# Patient Record
Sex: Female | Born: 1970
Health system: Southern US, Community
[De-identification: ages and names within clinical notes are randomized; demographics above are authoritative.]

## PROBLEM LIST (undated history)

## (undated) DIAGNOSIS — F1010 Alcohol abuse, uncomplicated: Secondary | ICD-10-CM

## (undated) DIAGNOSIS — J45909 Unspecified asthma, uncomplicated: Secondary | ICD-10-CM

## (undated) DIAGNOSIS — F419 Anxiety disorder, unspecified: Secondary | ICD-10-CM

## (undated) DIAGNOSIS — F32A Depression, unspecified: Secondary | ICD-10-CM

## (undated) DIAGNOSIS — F329 Major depressive disorder, single episode, unspecified: Secondary | ICD-10-CM

## (undated) DIAGNOSIS — J449 Chronic obstructive pulmonary disease, unspecified: Secondary | ICD-10-CM

## (undated) DIAGNOSIS — K08109 Complete loss of teeth, unspecified cause, unspecified class: Secondary | ICD-10-CM

## (undated) HISTORY — DX: Major depressive disorder, single episode, unspecified: F32.9

## (undated) HISTORY — DX: Depression, unspecified: F32.A

## (undated) HISTORY — DX: Anxiety disorder, unspecified: F41.9

## (undated) HISTORY — PX: TUBAL LIGATION: SHX77

---

## 2014-09-23 ENCOUNTER — Emergency Department (HOSPITAL_COMMUNITY): Payer: Self-pay

## 2014-09-23 ENCOUNTER — Encounter (HOSPITAL_COMMUNITY): Payer: Self-pay

## 2014-09-23 ENCOUNTER — Emergency Department (HOSPITAL_COMMUNITY)
Admission: EM | Admit: 2014-09-23 | Discharge: 2014-09-23 | Discharge status: Against Medical Advice | Payer: Self-pay | Attending: Emergency Medicine | Admitting: Emergency Medicine

## 2014-09-23 DIAGNOSIS — K644 Residual hemorrhoidal skin tags: Secondary | ICD-10-CM | POA: Insufficient documentation

## 2014-09-23 DIAGNOSIS — A599 Trichomoniasis, unspecified: Secondary | ICD-10-CM

## 2014-09-23 DIAGNOSIS — A5901 Trichomonal vulvovaginitis: Secondary | ICD-10-CM | POA: Insufficient documentation

## 2014-09-23 DIAGNOSIS — F1010 Alcohol abuse, uncomplicated: Secondary | ICD-10-CM | POA: Insufficient documentation

## 2014-09-23 DIAGNOSIS — R1110 Vomiting, unspecified: Secondary | ICD-10-CM | POA: Insufficient documentation

## 2014-09-23 DIAGNOSIS — Z79899 Other long term (current) drug therapy: Secondary | ICD-10-CM | POA: Insufficient documentation

## 2014-09-23 DIAGNOSIS — Z720 Tobacco use: Secondary | ICD-10-CM | POA: Insufficient documentation

## 2014-09-23 DIAGNOSIS — R197 Diarrhea, unspecified: Secondary | ICD-10-CM | POA: Insufficient documentation

## 2014-09-23 DIAGNOSIS — R188 Other ascites: Secondary | ICD-10-CM | POA: Insufficient documentation

## 2014-09-23 DIAGNOSIS — Z3202 Encounter for pregnancy test, result negative: Secondary | ICD-10-CM | POA: Insufficient documentation

## 2014-09-23 DIAGNOSIS — J45909 Unspecified asthma, uncomplicated: Secondary | ICD-10-CM | POA: Insufficient documentation

## 2014-09-23 HISTORY — DX: Unspecified asthma, uncomplicated: J45.909

## 2014-09-23 LAB — WET PREP, GENITAL
Clue Cells Wet Prep HPF POC: NONE SEEN
Yeast Wet Prep HPF POC: NONE SEEN

## 2014-09-23 LAB — COMPREHENSIVE METABOLIC PANEL
ALT: 26 U/L (ref 14–54)
AST: 27 U/L (ref 15–41)
Albumin: 3.9 g/dL (ref 3.5–5.0)
Alkaline Phosphatase: 54 U/L (ref 38–126)
Anion gap: 12 (ref 5–15)
BUN: 13 mg/dL (ref 6–20)
CO2: 23 mmol/L (ref 22–32)
Calcium: 9 mg/dL (ref 8.9–10.3)
Chloride: 107 mmol/L (ref 101–111)
Creatinine, Ser: 0.7 mg/dL (ref 0.44–1.00)
GFR calc Af Amer: >60 mL/min (ref >60)
GFR calc non Af Amer: >60 mL/min (ref >60)
Glucose, Bld: 83 mg/dL (ref 65–99)
Potassium: 3.9 mmol/L (ref 3.5–5.1)
Sodium: 142 mmol/L (ref 135–145)
Total Bilirubin: 0.2 mg/dL — ABNORMAL LOW (ref 0.3–1.2)
Total Protein: 6.9 g/dL (ref 6.5–8.1)

## 2014-09-23 LAB — CBC WITH DIFFERENTIAL/PLATELET
Basophils Absolute: 0.1 10*3/uL (ref 0.0–0.1)
Basophils Relative: 1 % (ref 0–1)
Eosinophils Absolute: 0.3 10*3/uL (ref 0.0–0.7)
Eosinophils Relative: 3 % (ref 0–5)
HCT: 46.4 % — ABNORMAL HIGH (ref 36.0–46.0)
Hemoglobin: 16 g/dL — ABNORMAL HIGH (ref 12.0–15.0)
Lymphocytes Relative: 30 % (ref 12–46)
Lymphs Abs: 3.1 10*3/uL (ref 0.7–4.0)
MCH: 34.6 pg — ABNORMAL HIGH (ref 26.0–34.0)
MCHC: 34.5 g/dL (ref 30.0–36.0)
MCV: 100.4 fL — ABNORMAL HIGH (ref 78.0–100.0)
Monocytes Absolute: 1.1 10*3/uL — ABNORMAL HIGH (ref 0.1–1.0)
Monocytes Relative: 10 % (ref 3–12)
Neutro Abs: 5.9 10*3/uL (ref 1.7–7.7)
Neutrophils Relative %: 56 % (ref 43–77)
Platelets: 245 10*3/uL (ref 150–400)
RBC: 4.62 MIL/uL (ref 3.87–5.11)
RDW: 13 % (ref 11.5–15.5)
WBC: 10.4 10*3/uL (ref 4.0–10.5)

## 2014-09-23 LAB — RAPID URINE DRUG SCREEN, HOSP PERFORMED
Amphetamines: NOT DETECTED
Barbiturates: NOT DETECTED
Benzodiazepines: NOT DETECTED
Cocaine: NOT DETECTED
Opiates: NOT DETECTED
Tetrahydrocannabinol: NOT DETECTED

## 2014-09-23 LAB — URINALYSIS, ROUTINE W REFLEX MICROSCOPIC
Bilirubin Urine: NEGATIVE
Glucose, UA: NEGATIVE mg/dL
Ketones, ur: NEGATIVE mg/dL
Nitrite: NEGATIVE
Protein, ur: 100 mg/dL — AB
Specific Gravity, Urine: <1.005 — ABNORMAL LOW (ref 1.005–1.030)
Urobilinogen, UA: 0.2 mg/dL (ref 0.0–1.0)
pH: 5.5 (ref 5.0–8.0)

## 2014-09-23 LAB — URINE MICROSCOPIC-ADD ON

## 2014-09-23 LAB — LIPASE, BLOOD: Lipase: 29 U/L (ref 22–51)

## 2014-09-23 LAB — POC URINE PREG, ED: Preg Test, Ur: NEGATIVE

## 2014-09-23 LAB — ETHANOL: Alcohol, Ethyl (B): 211 mg/dL — ABNORMAL HIGH (ref <5)

## 2014-09-23 LAB — ABO/RH: ABO/RH(D): O POS

## 2014-09-23 LAB — HCG, QUANTITATIVE, PREGNANCY: hCG, Beta Chain, Quant, S: <1 m[IU]/mL (ref <5)

## 2014-09-23 MED ORDER — AZITHROMYCIN 250 MG PO TABS
1000.0000 mg | ORAL_TABLET | Freq: Once | ORAL | Status: AC
  Administered 2014-09-23: 1000 mg via ORAL
  Filled 2014-09-23: qty 4

## 2014-09-23 MED ORDER — IOHEXOL 300 MG/ML  SOLN: 50.0000 mL | Freq: Once | INTRAMUSCULAR | Status: DC | PRN

## 2014-09-23 MED ORDER — LIDOCAINE HCL (PF) 1 % IJ SOLN
INTRAMUSCULAR | Status: AC
  Administered 2014-09-23: 0.9 mL
  Filled 2014-09-23: qty 5

## 2014-09-23 MED ORDER — METRONIDAZOLE 500 MG PO TABS
2000.0000 mg | ORAL_TABLET | Freq: Once | ORAL | Status: AC
  Administered 2014-09-23: 2000 mg via ORAL
  Filled 2014-09-23: qty 4

## 2014-09-23 MED ORDER — CEFTRIAXONE SODIUM 250 MG IJ SOLR
250.0000 mg | Freq: Once | INTRAMUSCULAR | Status: AC
  Administered 2014-09-23: 250 mg via INTRAMUSCULAR
  Filled 2014-09-23: qty 250

## 2014-09-23 MED ORDER — IOHEXOL 300 MG/ML  SOLN: 100.0000 mL | Freq: Once | INTRAMUSCULAR | Status: DC | PRN

## 2014-09-23 NOTE — ED Notes (Signed)
Pt asking how much longer. Called Ct and was advised 0630. Pt returned back to rrom.

## 2014-09-23 NOTE — ED Notes (Addendum)
Pt arrived by EMS from home. Pt states that she has had a positive pregnancy test from her OB/GYN, vaginal bleeding started yesterday. Pt states last menstrual cycle was 30 days ago. Unable to find any fetal heart tones by doppler.

## 2014-09-23 NOTE — ED Provider Notes (Addendum)
TIME SEEN: 4:30 AM  CHIEF COMPLAINT: Vaginal bleeding, abdominal distention  HPI: Pt is a 44 y.o. female with history of asthma who presents the emergency department with abdominal distention for the last 3-4 weeks. States she began having vaginal bleeding tonight. She states that [place]. She states one month ago her primary care doctor told her that she was pregnant. She states that she thinks she had a positive urine pregnancy test. She tells me that her last period however was 30 days ago. When I ask her the date she states March 30. When I asked patient what month it is currently she states July.  She reports she has had vomiting and diarrhea yesterday. No fever. No abdominal pain. No vaginal discharge. She has been pregnant twice before has had 2 children. She denies history of heavy alcohol use but states she did drink 3 beers today. Denies any history of cancer, weight loss.   ROS: See HPI Constitutional: no fever  Eyes: no drainage  ENT: no runny nose   Cardiovascular:  no chest pain  Resp: no SOB  GI: no vomiting GU: no dysuria Integumentary: no rash  Allergy: no hives  Musculoskeletal: no leg swelling  Neurological: no slurred speech ROS otherwise negative  PAST MEDICAL HISTORY/PAST SURGICAL HISTORY:  Past Medical History  Diagnosis Date  . Asthma     MEDICATIONS:  Prior to Admission medications   Medication Sig Start Date End Date Taking? Authorizing Provider  albuterol (PROVENTIL HFA;VENTOLIN HFA) 108 (90 BASE) MCG/ACT inhaler Inhale into the lungs every 6 (six) hours as needed for wheezing or shortness of breath.   Yes Historical Provider, MD    ALLERGIES:  No Known Allergies  SOCIAL HISTORY:  History  Substance Use Topics  . Smoking status: Current Every Day Smoker  . Smokeless tobacco: Not on file  . Alcohol Use: Yes    FAMILY HISTORY: No family history on file.  EXAM: BP 122/96 mmHg  Pulse 78  Temp(Src) 97.8 F (36.6  C) (Oral)  Resp 20  Ht 5\' 1"  (1.549 m)  Wt 100 lb (45.36 kg)  BMI 18.90 kg/m2  SpO2 100% CONSTITUTIONAL: Alert and oriented x 3 and does not appear to answer all questions appropriately and seems slightly confused, smells of alcohol, chronically ill-appearing HEAD: Normocephalic EYES: Conjunctivae clear, PERRL ENT: normal nose; no rhinorrhea; moist mucous membranes; pharynx without lesions noted NECK: Supple, no meningismus, no LAD  CARD: RRR; S1 and S2 appreciated; no murmurs, no clicks, no rubs, no gallops RESP: Normal chest excursion without splinting or tachypnea; breath sounds clear and equal bilaterally; no wheezes, no rhonchi, no rales, no hypoxia or respiratory distress, speaking full sentences ABD/GI: Normal bowel sounds; patient has a distended abdomen with fluid wave, no tympany, soft and nontender, no rebound or guarding, no peritoneal signs GU:  Patient has mild amount of dark red blood coming from the cervical disc, no adnexal tenderness or fullness, no cervical motion tenderness, no other vaginal discharge appreciated, normal external genitalia RECTAL:  Multiple nonthrombosed external hemorrhoids that do not appear to be bleeding BACK:  The back appears normal and is non-tender to palpation, there is no CVA tenderness EXT: Normal ROM in all joints; non-tender to palpation; no edema; normal capillary refill; no cyanosis, no calf tenderness or swelling    SKIN: Normal color for age and race; warm NEURO: Moves all extremities equally, sensation to light touch intact diffusely, cranial nerves II through XII intact, no  asterixis PSYCH: The patient's mood and manner are appropriate. Grooming and personal hygiene are appropriate.  MEDICAL DECISION MAKING: Patient here with abdominal distention. She claims that she was told she was pregnant one month ago but her urine pregnancy here is negative and bedside ultrasound shows no intrauterine pregnancy large volume ascites. Abdominal exam  is benign. I do not think she has SBP. She is afebrile. I suspect that this is either secondary to liver failure or cancer. Will obtain labs, CT of her abdomen and pelvis.  ED PROGRESS: Patient's labs showed alcohol level of 211. Her Quant hCG is less than 1. Wet prep is positive for trichomonas. Given she does not have outpatient follow-up and is an unreliable patient I will treat her not only with 2 g of Flagyl for her trichomonas but also with ceftriaxone and azithromycin to empirically cover her for gonorrhea and chlamydia. Her gonorrhea and chlamydia cultures are pending.  Otherwise her labs are unremarkable including normal LFTs and lipase.  6:15 AM  Pt drank all of her contrast but then decided she did not want to wait for the CT scan. Discussed with patient that CT scan would be performed at 6:30 AM. Have discussed with patient again that I think that her abdominal distention is secondary to ascites as she appears to have a large volume of fluid on bedside ultrasound but I discussed with patient that I cannot be sure if this is from liver failure or underlying cancer or other etiology. She still has a benign abdominal exam. She is has been able to drink contrast without any difficulty or vomiting. She is ambulatory. Oriented 3. I feel she has the capacity to make this decision to leave the emergency department AGAINST MEDICAL ADVICE. I have recommended she follow-up with her primary care doctor and have discussed return precautions. Have also advised her to stop drinking. She verbalized understanding is comfortable this plan and still plans to leave without receiving CT scan, further workup. Patient left prior to receiving paperwork.     Charlene Maw Charlene Azpeitia, DO 09/23/14 0622  Charlene Maw Charlene Bouffard, DO 09/23/14 [id number]

## 2014-09-23 NOTE — ED Notes (Signed)
Pt states she wants to leave & not wait for CT scan. Pt informed by this nurse she has only been treated for what we have found to this point. She was told we still do not know why her stomach is distended & that was what she came in to be checked for. EDP advised pt of the same including possible liver failure and cancer as possible dx. EDP also instructed pt that she needs to stop drinking also. Pt advised she should return to be checked out & to complete the testing. Pt left department w/ steady gait & went to waiting room.

## 2014-09-23 NOTE — ED Notes (Signed)
Pt states she is pregnant but cannot state how far along she is or when her last period was.  Pt c/o vaginal bleeding tonight.

## 2014-09-23 NOTE — ED Notes (Signed)
Pt informed POC pregnancy was negative. Pt then says why is her stomach so big?

## 2014-09-23 NOTE — ED Notes (Signed)
Pt ambulated to restroom & returned to room w/ no complications. 

## 2014-09-23 NOTE — ED Notes (Signed)
Pt came back to nurses station saying she wants to leave, IV removed, explained AMA. Pt signed & went to waiting room.

## 2014-09-23 NOTE — Discharge Instructions (Signed)
Alcohol Use Disorder °Alcohol use disorder is a mental disorder. It is not a one-time incident of heavy drinking. Alcohol use disorder is the excessive and uncontrollable use of alcohol over time that leads to problems with functioning in one or more areas of daily living. People with this disorder risk harming themselves and others when they drink to excess. Alcohol use disorder also can cause other mental disorders, such as mood and anxiety disorders, and serious physical problems. People with alcohol use disorder often misuse other drugs.  °Alcohol use disorder is common and widespread. Some people with this disorder drink alcohol to cope with or escape from negative life events. Others drink to relieve chronic pain or symptoms of mental illness. People with a family history of alcohol use disorder are at higher risk of losing control and using alcohol to excess.  °SYMPTOMS  °Signs and symptoms of alcohol use disorder may include the following:  °· Consumption of alcohol in larger amounts or over a longer period of time than intended. °· Multiple unsuccessful attempts to cut down or control alcohol use.   °· A great deal of time spent obtaining alcohol, using alcohol, or recovering from the effects of alcohol (hangover). °· A strong desire or urge to use alcohol (cravings).   °· Continued use of alcohol despite problems at work, school, or home because of alcohol use.   °· Continued use of alcohol despite problems in relationships because of alcohol use. °· Continued use of alcohol in situations when it is physically hazardous, such as driving a car. °· Continued use of alcohol despite awareness of a physical or psychological problem that is likely related to alcohol use. Physical problems related to alcohol use can involve the brain, heart, liver, stomach, and intestines. Psychological problems related to alcohol use include intoxication, depression, anxiety, psychosis, delirium, and dementia.   °· The need for  increased amounts of alcohol to achieve the same desired effect, or a decreased effect from the consumption of the same amount of alcohol (tolerance). °· Withdrawal symptoms upon reducing or stopping alcohol use, or alcohol use to reduce or avoid withdrawal symptoms. Withdrawal symptoms include: °· Racing heart. °· Hand tremor. °· Difficulty sleeping. °· Nausea. °· Vomiting. °· Hallucinations. °· Restlessness. °· Seizures. °DIAGNOSIS °Alcohol use disorder is diagnosed through an assessment by your health care provider. Your health care provider may start by asking three or four questions to screen for excessive or problematic alcohol use. To confirm a diagnosis of alcohol use disorder, at least two symptoms must be present within a 12-month period. The severity of alcohol use disorder depends on the number of symptoms: °· Mild--two or three. °· Moderate--four or five. °· Severe--six or more. °Your health care provider may perform a physical exam or use results from lab tests to see if you have physical problems resulting from alcohol use. Your health care provider may refer you to a mental health professional for evaluation. °TREATMENT  °Some people with alcohol use disorder are able to reduce their alcohol use to low-risk levels. Some people with alcohol use disorder need to quit drinking alcohol. When necessary, mental health professionals with specialized training in substance use treatment can help. Your health care provider can help you decide how severe your alcohol use disorder is and what type of treatment you need. The following forms of treatment are available:  °· Detoxification. Detoxification involves the use of prescription medicines to prevent alcohol withdrawal symptoms in the first week after quitting. This is important for people with a history of symptoms   of withdrawal and for heavy drinkers who are likely to have withdrawal symptoms. Alcohol withdrawal can be dangerous and, in severe cases, cause  death. Detoxification is usually provided in a hospital or in-patient substance use treatment facility.  Counseling or talk therapy. Talk therapy is provided by substance use treatment counselors. It addresses the reasons people use alcohol and ways to keep them from drinking again. The goals of talk therapy are to help people with alcohol use disorder find healthy activities and ways to cope with life stress, to identify and avoid triggers for alcohol use, and to handle cravings, which can cause relapse.  Medicines.Different medicines can help treat alcohol use disorder through the following actions:  Decrease alcohol cravings.  Decrease the positive reward response felt from alcohol use.  Produce an uncomfortable physical reaction when alcohol is used (aversion therapy).  Support groups. Support groups are run by people who have quit drinking. They provide emotional support, advice, and guidance. These forms of treatment are often combined. Some people with alcohol use disorder benefit from intensive combination treatment provided by specialized substance use treatment centers. Both inpatient and outpatient treatment programs are available. Document Released: 03/27/2004 Document Revised: 07/04/2013 Document Reviewed: 05/27/2012 [facility] Patient Information 2015 [place], [place]. This information is not intended to replace advice given to you by your health care provider. Make sure you discuss any questions you have with your health care provider.  Ascites Ascites is a gathering of fluid in the belly (abdomen). This is most often caused by liver disease. It may also be caused by a number of other less common problems. It causes a ballooning out (distension) of the abdomen. CAUSES  Scarring of the liver (cirrhosis) is the most common cause of ascites. Other causes include:  Infection or inflammation in the abdomen.  Cancer in the abdomen.  Heart failure.  Certain forms of kidney failure  (nephritic syndrome).  Inflammation of the pancreas.  Clots in the veins of the liver. SYMPTOMS  In the early stages of ascites, you may not have any symptoms. The main symptom of ascites is a sense of abdominal bloating. This is due to the presence of fluid. This may also cause an increase in abdominal or waist size. People with this condition can develop swelling in the legs, and men can develop a swollen scrotum. When there is a lot of fluid, it may be hard to breath. Stretching of the abdomen by fluid can be painful. DIAGNOSIS  Certain features of your medical history, such as a history of liver disease and of an enlarging abdomen, can suggest the presence of ascites. The diagnosis of ascites can be made on physical exam by your caregiver. An abdominal ultrasound examination can confirm that ascites is present, and estimate the amount of fluid. Once ascites is confirmed, it is important to determine its cause. Again, a history of one of the conditions listed in "CAUSES" provides a strong clue. A physical exam is important, and blood and X-ray tests may be needed. During a procedure called paracentesis, a sample of fluid is removed from the abdomen. This can determine certain key features about the fluid, such as whether or not infection or cancer is present. Your caregiver will determine if a paracentesis is necessary. They will describe the procedure to you. PREVENTION  Ascites is a complication of other conditions. Therefore to prevent ascites, you must seek treatment for any significant health conditions you have. Once ascites is present, careful attention to fluid and salt intake may  help prevent it from getting worse. If you have ascites, you should not drink alcohol. PROGNOSIS  The prognosis of ascites depends on the underlying disease. If the disease is reversible, such as with certain infections or with heart failure, then ascites may improve or disappear. When ascites is caused by  cirrhosis, then it indicates that the liver disease has worsened, and further evaluation and treatment of the liver disease is needed. If your ascites is caused by cancer, then the success or failure of the cancer treatment will determine whether your ascites will improve or worsen. RISKS AND COMPLICATIONS  Ascites is likely to worsen if it is not properly diagnosed and treated. A large amount of ascites can cause pain and difficulty breathing. The main complication, besides worsening, is infection (called spontaneous bacterial peritonitis). This requires prompt treatment. TREATMENT  The treatment of ascites depends on its cause. When liver disease is your cause, medical management using water pills (diuretics) and decreasing salt intake is often effective. Ascites due to peritoneal inflammation or malignancy (cancer) alone does not respond to salt restriction and diuretics. Hospitalization is sometimes required. If the treatment of ascites cannot be managed with medications, a number of other treatments are available. Your caregivers will help you decide which will work best for you. Some of these are:  Removal of fluid from the abdomen (paracentesis).  Fluid from the abdomen is passed into a vein (peritoneovenous shunting).  Liver transplantation.  Transjugular intrahepatic portosystemic stent shunt. HOME CARE INSTRUCTIONS  It is important to monitor body weight and the intake and output of fluids. Weigh yourself at the same time every day. Record your weights. Fluid restriction may be necessary. It is also important to know your salt intake. The more salt you take in, the more fluid you will retain. Ninety percent of people with ascites respond to this approach.  Follow any directions for medicines carefully.  Follow up with your caregiver, as directed.  Report any changes in your health, especially any new or worsening symptoms.  If your ascites is from liver disease, avoid alcohol and  other substances toxic to the liver. SEEK MEDICAL CARE IF:   Your weight increases more than a few pounds in a few days.  Your abdominal or waist size increases.  You develop swelling in your legs.  You had swelling and it worsens. SEEK IMMEDIATE MEDICAL CARE IF:   You develop a fever.  You develop new abdominal pain.  You develop difficulty breathing.  You develop confusion.  You have bleeding from the mouth, stomach, or rectum. MAKE SURE YOU:   Understand these instructions.  Will watch your condition.  Will get help right away if you are not doing well or get worse. Document Released: 02/17/2005 Document Revised: 05/12/2011 Document Reviewed: 09/18/2006 [facility] Patient Information 2015 [place], [place]. This information is not intended to replace advice given to you by your health care provider. Make sure you discuss any questions you have with your health care provider.  Trichomoniasis Trichomoniasis is an infection caused by an organism called Trichomonas. The infection can affect both women and men. In women, the outer female genitalia and the vagina are affected. In men, the penis is mainly affected, but the prostate and other reproductive organs can also be involved. Trichomoniasis is a sexually transmitted infection (STI) and is most often passed to another person through sexual contact.  RISK FACTORS  Having unprotected sexual intercourse.  Having sexual intercourse with an infected partner. SIGNS AND SYMPTOMS  Symptoms of  trichomoniasis in women include:  Abnormal gray-green frothy vaginal discharge.  Itching and irritation of the vagina.  Itching and irritation of the area outside the vagina. Symptoms of trichomoniasis in men include:   Penile discharge with or without pain.  Pain during urination. This results from inflammation of the urethra. DIAGNOSIS  Trichomoniasis may be found during a Pap test or physical exam. Your health care provider may use  one of the following methods to help diagnose this infection:  Examining vaginal discharge under a microscope. For men, urethral discharge would be examined.  Testing the pH of the vagina with a test tape.  Using a vaginal swab test that checks for the Trichomonas organism. A test is available that provides results within a few minutes.  Doing a culture test for the organism. This is not usually needed. TREATMENT   You may be given medicine to fight the infection. Women should inform their health care provider if they could be or are pregnant. Some medicines used to treat the infection should not be taken during pregnancy.  Your health care provider may recommend over-the-counter medicines or creams to decrease itching or irritation.  Your sexual partner will need to be treated if infected. HOME CARE INSTRUCTIONS   Take medicines only as directed by your health care provider.  Take over-the-counter medicine for itching or irritation as directed by your health care provider.  Do not have sexual intercourse while you have the infection.  Women should not douche or wear tampons while they have the infection.  Discuss your infection with your partner. Your partner may have gotten the infection from you, or you may have gotten it from your partner.  Have your sex partner get examined and treated if necessary.  Practice safe, informed, and protected sex.  See your health care provider for other STI testing. SEEK MEDICAL CARE IF:   You still have symptoms after you finish your medicine.  You develop abdominal pain.  You have pain when you urinate.  You have bleeding after sexual intercourse.  You develop a rash.  Your medicine makes you sick or makes you throw up (vomit). MAKE SURE YOU:  Understand these instructions.  Will watch your condition.  Will get help right away if you are not doing well or get worse. Document Released: 08/13/2000 Document Revised: 07/04/2013  Document Reviewed: 11/29/2012 [facility] Patient Information 2015 [place], [place]. This information is not intended to replace advice given to you by your health care provider. Make sure you discuss any questions you have with your health care provider.

## 2014-09-25 LAB — GC/CHLAMYDIA PROBE AMP (~~LOC~~) NOT AT ARMC
Chlamydia: NEGATIVE
Neisseria Gonorrhea: NEGATIVE

## 2014-10-31 ENCOUNTER — Encounter (HOSPITAL_COMMUNITY): Payer: Self-pay | Admitting: *Deleted

## 2014-10-31 ENCOUNTER — Inpatient Hospital Stay (HOSPITAL_COMMUNITY)
Admission: AD | Admit: 2014-10-31 | Discharge: 2014-10-31 | Discharge status: Against Medical Advice | Payer: Self-pay | Source: Ambulatory Visit | Attending: Obstetrics & Gynecology | Admitting: Obstetrics & Gynecology

## 2014-10-31 DIAGNOSIS — F17200 Nicotine dependence, unspecified, uncomplicated: Secondary | ICD-10-CM | POA: Insufficient documentation

## 2014-10-31 DIAGNOSIS — R109 Unspecified abdominal pain: Secondary | ICD-10-CM | POA: Insufficient documentation

## 2014-10-31 DIAGNOSIS — Z3202 Encounter for pregnancy test, result negative: Secondary | ICD-10-CM | POA: Insufficient documentation

## 2014-10-31 HISTORY — DX: Alcohol abuse, uncomplicated: F10.10

## 2014-10-31 HISTORY — DX: Complete loss of teeth, unspecified cause, unspecified class: K08.109

## 2014-10-31 LAB — URINALYSIS, ROUTINE W REFLEX MICROSCOPIC
Bilirubin Urine: NEGATIVE
Glucose, UA: NEGATIVE mg/dL
Ketones, ur: NEGATIVE mg/dL
Nitrite: NEGATIVE
Protein, ur: NEGATIVE mg/dL
Specific Gravity, Urine: <1.005 — ABNORMAL LOW (ref 1.005–1.030)
Urobilinogen, UA: 0.2 mg/dL (ref 0.0–1.0)
pH: 5.5 (ref 5.0–8.0)

## 2014-10-31 LAB — RAPID URINE DRUG SCREEN, HOSP PERFORMED
Amphetamines: NOT DETECTED
Barbiturates: NOT DETECTED
Benzodiazepines: NOT DETECTED
Cocaine: POSITIVE — AB
Opiates: NOT DETECTED
Tetrahydrocannabinol: NOT DETECTED

## 2014-10-31 LAB — URINE MICROSCOPIC-ADD ON

## 2014-10-31 LAB — POCT PREGNANCY, URINE: Preg Test, Ur: NEGATIVE

## 2014-10-31 NOTE — Progress Notes (Signed)
L. Clemmons, CNM in to assess patient.

## 2014-10-31 NOTE — Progress Notes (Signed)
Patient came out to nurse's desk and stated she was going to go outside. Encouraged patient to remain in room so that provider could speak with her regarding plan of care. Patient insisted that she go outside, that she was not leaving and would return to her room.

## 2014-10-31 NOTE — MAU Provider Note (Signed)
  History     CSN: [id number]  Arrival date and time: 10/31/14 1748   None     Chief Complaint  Patient presents with  . Abdominal Pain   HPI  Charlene Woods 44 y.o. [id number] Presents via EMS stating that she is having abdominal pain that started last night and she thinks she is pregnant. She is homeless.  Past Medical History  Diagnosis Date  . Asthma   . Teeth missing   . Alcohol abuse     No past surgical history on file.  No family history on file.  Social History  Substance Use Topics  . Smoking status: Current Every Day Smoker  . Smokeless tobacco: None  . Alcohol Use: Yes    Allergies: No Known Allergies  Prescriptions prior to admission  Medication Sig Dispense Refill Last Dose  . albuterol (PROVENTIL HFA;VENTOLIN HFA) 108 (90 BASE) MCG/ACT inhaler Inhale into the lungs every 6 (six) hours as needed for wheezing or shortness of breath.       Review of Systems  Gastrointestinal: Positive for abdominal pain.   Physical Exam   Blood pressure 134/86, pulse 62, temperature 98.4 F (36.9 C), temperature source Oral, resp. rate 18.  Physical Exam  Nursing note and vitals reviewed. Constitutional: She appears well-developed. No distress.  HENT:  Head: Normocephalic.  Cardiovascular: Normal rate.   Respiratory: Effort normal.  GI:  Abdominal ascites. Nontender on palpation  Skin: She is not diaphoretic. There is erythema.  Skin has reddened appearance  Psychiatric:  Pt is convinced that she is pregnant    MAU Course  Procedures  MDM Discussed appropriateness of pt being here at [facility] with negative pregnancy test. Consulted Dr. Lynetta Mare and Dr Alvester Morin re POC- Will draw labs and posssibly transfer to [facility] pending results. Pt told nurses that she wanted to sign out AMA but did not; she went out side and did not come back.  Assessment and Plan  Abdominal Pain  Clemmons,Lori Grissett 10/31/2014, 7:05 PM

## 2014-10-31 NOTE — Progress Notes (Signed)
To BR .Urine sample obtained

## 2014-10-31 NOTE — Progress Notes (Signed)
Patient not in room when lab came to draw blood.

## 2014-10-31 NOTE — MAU Note (Addendum)
Patient arrived via EMS. C/O LUQ pain that started after she vomited X 1. [facility] and [facility]. States she drank a 40 oz beer. Patient obviously intoxicated.

## 2014-10-31 NOTE — MAU Note (Signed)
Pt has not returned, is not in lobby, can not see her outside of unit- removed from board

## 2015-06-12 ENCOUNTER — Encounter (HOSPITAL_COMMUNITY): Payer: Self-pay | Admitting: Emergency Medicine

## 2015-06-12 ENCOUNTER — Emergency Department (HOSPITAL_COMMUNITY)
Admission: EM | Admit: 2015-06-12 | Discharge: 2015-06-13 | Disposition: A | Discharge status: Home / Self Care | Payer: Self-pay | Attending: Emergency Medicine | Admitting: Emergency Medicine

## 2015-06-12 DIAGNOSIS — W57XXXA Bitten or stung by nonvenomous insect and other nonvenomous arthropods, initial encounter: Secondary | ICD-10-CM | POA: Insufficient documentation

## 2015-06-12 DIAGNOSIS — Y939 Activity, unspecified: Secondary | ICD-10-CM | POA: Insufficient documentation

## 2015-06-12 DIAGNOSIS — J45909 Unspecified asthma, uncomplicated: Secondary | ICD-10-CM | POA: Insufficient documentation

## 2015-06-12 DIAGNOSIS — F17200 Nicotine dependence, unspecified, uncomplicated: Secondary | ICD-10-CM | POA: Insufficient documentation

## 2015-06-12 DIAGNOSIS — T63441A Toxic effect of venom of bees, accidental (unintentional), initial encounter: Secondary | ICD-10-CM | POA: Insufficient documentation

## 2015-06-12 DIAGNOSIS — Y929 Unspecified place or not applicable: Secondary | ICD-10-CM | POA: Insufficient documentation

## 2015-06-12 DIAGNOSIS — Y999 Unspecified external cause status: Secondary | ICD-10-CM | POA: Insufficient documentation

## 2015-06-12 NOTE — ED Notes (Signed)
Pt states she was stung on the left middle finger and then developed sob. Pt states the sob is better at this time.

## 2015-06-13 MED ORDER — DIPHENHYDRAMINE HCL 25 MG PO TABS: 25.0000 mg | ORAL_TABLET | Freq: Four times a day (QID) | ORAL | Status: DC

## 2015-06-13 MED ORDER — PREDNISONE 50 MG PO TABS
60.0000 mg | ORAL_TABLET | Freq: Once | ORAL | Status: AC
  Administered 2015-06-13: 60 mg via ORAL
  Filled 2015-06-13: qty 1

## 2015-06-13 MED ORDER — DIPHENHYDRAMINE HCL 25 MG PO CAPS
25.0000 mg | ORAL_CAPSULE | Freq: Once | ORAL | Status: AC
  Administered 2015-06-13: 25 mg via ORAL
  Filled 2015-06-13: qty 1

## 2015-06-13 NOTE — ED Provider Notes (Signed)
CSN: [id number]     Arrival date & time 06/12/15  2308 History   First MD Initiated Contact with Patient 06/13/15 0000     Chief Complaint  Patient presents with  . Insect Bite     (Consider location/radiation/quality/duration/timing/severity/associated sxs/prior Treatment) HPI   Charlene Woods is a 45 y.o. female who presents to the Emergency Department complaining of bee sting to her left middle finger.  She states that she felt a bee sting on her finger while walking up her steps.  She states she did not see the insect.  She describes a throbbing stinging sensation to the tip of her finger.  She also reports a brief episode of shortness of breath at onset, but has resolved since ED arrival.  She has not tried any therapies prior to arrival.  She reports a history of previous allergy to bee stings including difficulty breathing.  She denies swelling of her face, lips or tongue, numbness of her finger or shortness of breath at present.     Past Medical History  Diagnosis Date  . Asthma   . Teeth missing   . Alcohol abuse    History reviewed. No pertinent past surgical history. History reviewed. No pertinent family history. Social History  Substance Use Topics  . Smoking status: Current Every Day Smoker  . Smokeless tobacco: None  . Alcohol Use: Yes   OB History    Gravida Para Term Preterm AB TAB SAB Ectopic Multiple Living   Review of Systems  Constitutional: Negative for fever and chills.  HENT: Negative for facial swelling, trouble swallowing and voice change.   Respiratory: Negative for chest tightness, shortness of breath, wheezing and stridor.   Cardiovascular: Negative for chest pain.  Musculoskeletal: Positive for arthralgias. Negative for joint swelling.  Skin:       Bee sting left middle finger  Neurological: Negative for dizziness, weakness and numbness.  All other systems reviewed and are negative.     Allergies  Aspirin; Bee pollen;  Codeine; Morphine and related; and Penicillins  Home Medications   Prior to Admission medications   Medication Sig Start Date End Date Taking? Authorizing Provider  albuterol (PROVENTIL HFA;VENTOLIN HFA) 108 (90 BASE) MCG/ACT inhaler Inhale into the lungs every 6 (six) hours as needed for wheezing or shortness of breath.    Historical Provider, MD   BP 117/105 mmHg  Pulse 65  Temp(Src) 98.8 F (37.1 C) (Oral)  Resp 14  Ht  (1.6 m)  Wt 58.968 kg  BMI 23.03 kg/m2  SpO2 96%  LMP 06/12/2015 Physical Exam  Constitutional: She is oriented to person, place, and time. She appears well-developed and well-nourished. No distress.  HENT:  Head: Atraumatic.  Mouth/Throat: Uvula is midline, oropharynx is clear and moist and mucous membranes are normal. No uvula swelling.  Neck: Normal range of motion. Neck supple.  Cardiovascular: Normal rate, regular rhythm and intact distal pulses.   Pulmonary/Chest: Effort normal and breath sounds normal. No respiratory distress. She has no wheezes.  Musculoskeletal: Normal range of motion.  Neurological: She is alert and oriented to person, place, and time. Coordination normal.  Skin: Skin is warm and dry.  Localized bee sting to the distal tip of the left middle finger.  No edema or lymphangitis.  Pt has full ROM of the finger.  Sensation intact  Psychiatric: She has a normal mood and affect. Thought content normal.  Nursing  note and vitals reviewed.   ED Course  Procedures (including critical care time) Labs Review Labs Reviewed - No data to display  Imaging Review No results found. I have personally reviewed and evaluated these images and lab results as part of my medical decision-making.   EKG Interpretation None      MDM   Final diagnoses:  Bee sting, accidental or unintentional, initial encounter    Pt with bee sting to left middle finger.  Localized reaction.  She has been observed in the dept without complications.  Drank  fluids and watching TV.  Stable for discharge.     Pauline Ausammy Kenzie Thoreson, PA-C 06/13/15 0100  Dione Boozeavid Glick, MD 06/13/15 [id number]

## 2015-06-13 NOTE — Discharge Instructions (Signed)
Bee, Wasp, or [employer], wasps, and hornets are part of a family of insects that can sting people. These stings can cause pain and inflammation, but they are usually not serious. However, some people may have an allergic reaction to a sting. This can cause the symptoms to be more severe.  SYMPTOMS  Common symptoms of this condition include:   A red lump in the skin that sometimes has a tiny hole in the center. In some cases, a stinger may be in the center of the wound.  Pain and itching at the sting site.  Redness and swelling around the sting site. If you have an allergic reaction (localized allergic reaction), the swelling and redness may spread out from the sting site. In some cases, this reaction can continue to develop over the next 12-36 hours. In rare cases, a person may have a severe allergic reaction (anaphylactic reaction) to a sting. Symptoms of an anaphylactic reaction may include:   Wheezing or difficulty breathing.  Raised, itchy, red patches on the skin.  Nausea or vomiting.  Abdominal cramping.  Diarrhea.  Chest pain.  Fainting.  Redness of the face (flushing). DIAGNOSIS  This condition is usually diagnosed based on symptoms, medical history, and a physical exam. TREATMENT  Most stings can be treated with:   Icing to reduce swelling.  Medicines (antihistamines) to treat itching or an allergic reaction.  Medicines to help reduce pain. These may be medicines that you take by mouth, or medicated creams or lotions that you apply to your skin. If you were stung by a bee, the stinger and a small sac of poison may be in the wound. This may be removed by brushing across it with a flat card, such as a credit card. Another method is to pinch the area and pull it out. These methods can help reduce the severity of the body's reaction to the sting.  HOME CARE INSTRUCTIONS   Wash the sting site daily with soap and water as told by your health care provider.  Apply  or take over-the-counter and prescription medicines only as told by your health care provider.  If directed, apply ice to the sting area.  Put ice in a plastic bag.  Place a towel between your skin and the bag.  Leave the ice on for 20 minutes, 2-3 times per day.  Do not scratch the sting area.  To lessen pain, try using a paste that is made of water and baking soda. Rub the paste on the sting area and leave it on for 5 minutes.  If you had a severe allergic reaction to a sting, you may need:  To wear a medical bracelet or necklace that lists the allergy.  To learn when and how to use an anaphylaxis kit or epinephrine injection. Your family members may also need to learn this.  To carry an anaphylaxis kit with you at all times. SEEK MEDICAL CARE IF:   Your symptoms do not get better in 2-3 days.  You have redness, swelling, or pain that spreads beyond the area of the sting.  You have a fever. SEEK IMMEDIATE MEDICAL CARE IF:  You have symptoms of a severe allergic reaction. These include:   Wheezing or difficulty breathing.  Chest pain.  Light-headedness or fainting.  Itchy, raised, red patches on the skin.  Nausea or vomiting.  Abdominal cramping.  Diarrhea.   This information is not intended to replace advice given to you by your health care provider.  Make sure you discuss any questions you have with your health care provider. °  °Document Released: 02/17/2005 Document Revised: 11/08/2014 Document Reviewed: 07/05/2014 °Elsevier Interactive Patient Education ©2016 [employer] ° °

## 2015-08-15 ENCOUNTER — Other Ambulatory Visit: Payer: Self-pay | Admitting: Internal Medicine

## 2015-08-15 ENCOUNTER — Ambulatory Visit: Payer: Self-pay | Attending: Internal Medicine | Admitting: *Deleted

## 2015-08-15 ENCOUNTER — Ambulatory Visit
Admission: RE | Admit: 2015-08-15 | Discharge: 2015-08-15 | Disposition: A | Discharge status: Home / Self Care | Payer: Self-pay | Source: Ambulatory Visit | Attending: Internal Medicine | Admitting: Internal Medicine

## 2015-08-15 ENCOUNTER — Encounter: Payer: Self-pay | Admitting: *Deleted

## 2015-08-15 VITALS — BP 124/84 | HR 53 | Temp 98.3°F | Ht 62.99 in | Wt 127.2 lb

## 2015-08-15 DIAGNOSIS — Z0000 Encounter for general adult medical examination without abnormal findings: Secondary | ICD-10-CM

## 2015-08-15 NOTE — Progress Notes (Signed)
Subjective:     Patient ID: Charlene Woods, female   DOB: [birth year], 45 y.o.   MRN: [id number]  HPI   Review of Systems     Objective:   Physical Exam  Pulmonary/Chest: Right breast exhibits no inverted nipple, no mass, no nipple discharge, no skin change and no tenderness. Left breast exhibits no inverted nipple, no mass, no nipple discharge, no skin change and no tenderness. Breasts are symmetrical.  Abdominal: There is no splenomegaly or hepatomegaly. There is tenderness in the left lower quadrant.  Genitourinary: No labial fusion. There is no rash, tenderness, lesion or injury on the right labia. There is no rash, tenderness, lesion or injury on the left labia. Cervix exhibits friability. Cervix exhibits no discharge. Right adnexum displays no mass, no tenderness and no fullness. Left adnexum displays no mass, no tenderness and no fullness. No erythema, tenderness or bleeding in the vagina. No foreign body around the vagina. No signs of injury around the vagina. No vaginal discharge found.         Assessment:     45 year old White female [facility] exam, pap, mammogram and further evaluation of a pelvic mass.  Clinical breast exam is unremarkable.  Taught self breast awareness.  Specimen collected for pap smear.  There is excoriation noted around the cerivcal os.   On bimanual exam the cervix is firm, full and non-mobile.  There is no dominant pelvic mass noted, but there is tenderness noted in LLQ on exam.  Patient has been screened for eligibility.  She does not have any insurance, Medicare or Medicaid.  She also meets financial eligibility.  Hand-out given on the Affordable Care Act.     Plan:     Screening mammogram ordered.  Specimen for pap smear sent to the lab.  Charlene QuailsChristy Woods will schedule patient for further evaluation of abnormal cervix with [facility].  Will follow up per [facility] protocol.

## 2015-08-15 NOTE — Patient Instructions (Signed)
Gave patient hand-out, Women Staying Healthy, Active and Well from [facility], with education on breast health, pap smears, heart and colon health. 

## 2015-08-17 LAB — PAP LB AND HPV HIGH-RISK
HPV, high-risk: NEGATIVE
PAP Smear Comment: 0

## 2015-08-20 ENCOUNTER — Telehealth: Payer: Self-pay | Admitting: *Deleted

## 2015-08-20 NOTE — Telephone Encounter (Signed)
Patient called to get the results of her mammogram and pap smear.  Explained that both are normal, and that the pap is HPV negative.  Explained that although they both were normal, she still needs to keep her appointment with Dr. Greggory Keenefrancesco on 09/05/15 for further evaluation of the firm, heavy cervix and possible pelvic mass.  She is agreeable.  Next mammo will be due in 1 year and explained that next pap will be due in 5 years, unless otherwise indicated by Dr. Greggory Keenefrancesco.

## 2015-09-05 ENCOUNTER — Encounter: Payer: Self-pay | Admitting: Obstetrics and Gynecology

## 2015-09-13 ENCOUNTER — Telehealth: Payer: Self-pay | Admitting: *Deleted

## 2015-09-13 NOTE — Telephone Encounter (Signed)
Patient no-showed her appointment at [facility] to follow-up on her pelvic mass and full cervix.  Rescheduled patient for 10/18/15 @ 9:30.  Offered a taxi for transportation.  Stressed the importance of keeping her appointment.  States she will call me if she does not have transportation.

## 2015-10-18 ENCOUNTER — Ambulatory Visit (INDEPENDENT_AMBULATORY_CARE_PROVIDER_SITE_OTHER): Payer: Self-pay | Admitting: Obstetrics and Gynecology

## 2015-10-18 ENCOUNTER — Encounter: Payer: Self-pay | Admitting: Obstetrics and Gynecology

## 2015-10-18 VITALS — BP 125/88 | HR 65 | Ht 63.0 in | Wt 128.5 lb

## 2015-10-18 DIAGNOSIS — R1032 Left lower quadrant pain: Secondary | ICD-10-CM | POA: Insufficient documentation

## 2015-10-18 DIAGNOSIS — N889 Noninflammatory disorder of cervix uteri, unspecified: Secondary | ICD-10-CM

## 2015-10-18 NOTE — Patient Instructions (Signed)
1. Pelvic exam is normal today except for some left lower quadrant tenderness. No palpable abnormality is identified 2. Pelvic ultrasound is ordered to assess for any pelvic abnormality possibly contributing to the pain

## 2015-10-18 NOTE — Progress Notes (Signed)
GYN ENCOUNTER NOTE  Subjective:       Charlene Woods is a 45 y.o. [phone] female is here for gynecologic evaluation of the following issues:  1. Bulky cervix 2. Left adnexal mass  [facility] evaluation of above concerns. Pap smear was negative/negative for abnormality. On clinical exam the cervix was thought to be nonmobile, firm, and full; left lower quadrant tenderness was identified without mass  GYN history: Menarche-age 45 Intervals-24 days Duration of flow-3-4 days No history of dysmenorrhea No history of dyspareunia; currently not sexually active No history of abnormal Pap smears  GI history: Bowel movements-daily No history of chronic constipation  GU history: Urinary frequency 4-5 times a day Nocturia 2-3 times per night Stress urinary incontinence present with need to wear pads No history of urgency No history of chronic UTIs or pyelonephritis    Gynecologic History Patient's last menstrual period was 10/08/2015 (exact date). Contraception: abstinence Last Pap: Negative/negative 08/15/2015  Obstetric History OB History  Gravida Para Term Preterm AB Living  6 6 5 1  0 5  SAB TAB Ectopic Multiple Live Births  0 0 0 0 6    # Outcome Date GA Lbr Len/2nd Weight Sex Delivery Anes PTL Lv  6 Term      Vag-Spont   LIV  5 Term      Vag-Spont   LIV  4 Term      Vag-Spont   LIV  3 Term      Vag-Spont   LIV  2 Term      Vag-Spont   LIV  1 Preterm      Vag-Spont   DEC      Past Medical History:  Diagnosis Date  . Alcohol abuse   . Anxiety   . Asthma   . Depression   . Teeth missing     Past Surgical History:  Procedure Laterality Date  . TUBAL LIGATION      Current Outpatient Prescriptions on File Prior to Visit  Medication Sig Dispense Refill  . albuterol (PROVENTIL HFA;VENTOLIN HFA) 108 (90 BASE) MCG/ACT inhaler Inhale into the lungs every 6 (six) hours as needed for wheezing or shortness of breath.    .  diphenhydrAMINE (BENADRYL) 25 MG tablet Take 1 tablet (25 mg total) by mouth every 6 (six) hours. 20 tablet 0   No current facility-administered medications on file prior to visit.     Allergies  Allergen Reactions  . Aspirin   . Bee Pollen   . Codeine   . Morphine And Related   . Penicillins     Social History   Social History  . Marital status: Married    Spouse name: N/A  . Number of children: N/A  . Years of education: N/A   Occupational History  . Not on file.   Social History Main Topics  . Smoking status: Current Every Day Smoker    Packs/day: 0.05    Types: Cigarettes  . Smokeless tobacco: Not on file  . Alcohol use Yes     Comment: 4-5 qd   . Drug use: No  . Sexual activity: Yes    Birth control/ protection: Surgical   Other Topics Concern  . Not on file   Social History Narrative  . No narrative on file    Family History  Problem Relation Age of Onset  . Breast cancer Mother 2239  . Diabetes Mother   . Ovarian cancer Neg Hx   .  Colon cancer Neg Hx     The following portions of the patient's history were reviewed and updated as appropriate: allergies, current medications, past family history, past medical history, past social history, past surgical history and problem list.  Review of Systems Review of Systems - General ROS: negative for - chills, fatigue, fever, hot flashes, malaise or night sweats Hematological and Lymphatic ROS: negative for - bleeding problems or swollen lymph nodes Gastrointestinal ROS: negative for - abdominal pain, blood in stools, change in bowel habits and nausea/vomiting Musculoskeletal ROS: negative for - joint pain, muscle pain or muscular weakness Genito-Urinary ROS: negative for - change in menstrual cycle, dysmenorrhea, dyspareunia, dysuria, genital discharge, genital ulcers, hematuria, incontinence, irregular/heavy menses, nocturia. POSITIVE-left lower quadrant discomfort  Objective:   BP 125/88   Pulse 65   Ht 5'  3" (1.6 m)   Wt 128 lb 8 oz (58.3 kg)   LMP 10/08/2015 (Exact Date)   BMI 22.76 kg/m  CONSTITUTIONAL: Well-developed, well-nourished female in no acute distress.  HENT:  Normocephalic, atraumatic.  NECK: Normal range of motion, supple, no masses.  Normal thyroid.  SKIN: Skin is warm and dry. No rash noted. Not diaphoretic. No erythema. No pallor. NEUROLGIC: Alert and oriented to person, place, and time. PSYCHIATRIC: Normal mood and affect. Normal behavior. Normal judgment and thought content. CARDIOVASCULAR:Not Examined RESPIRATORY: Not Examined BREASTS: Not Examined ABDOMEN: Soft, non distended; Non tender.  No Organomegaly. PELVIC:  External Genitalia: Normal  BUS: Normal  Vagina: Normal  Cervix: Normal; parous; no gross lesions; no cervical motion tenderness; MOBILE  Uterus: Normal size, shape,consistency, mobile, anteverted, nontender  Adnexa: No palpable masses; left lower quadrant tenderness 1/4 without peritoneal signs  RV: External hemorrhoid is noted; internal exam not done  Bladder: Nontender MUSCULOSKELETAL: Normal range of motion. No tenderness.  No cyanosis, clubbing, or edema.     Assessment:   1. Left lower quadrant pain; bowel and bladder function are normal; uncertain etiology, nonacute - US Pelvis Complete; Future - US Transvaginal Non-OB; Future  2. Normal parous cervix consistent with multiparous female having 5 children vaginally; Pap-negative/negative     Plan:   1. Pelvic ultrasound. Patient will be notified of results. 2. Reassurance is given regarding cervix examination and Pap smear  Charlene HarmsMartin A Dia Donate, MD  Note: This dictation was prepared with Dragon dictation along with smaller phrase technology. Any transcriptional errors that result from this process are unintentional.

## 2015-10-23 ENCOUNTER — Ambulatory Visit (INDEPENDENT_AMBULATORY_CARE_PROVIDER_SITE_OTHER): Payer: Self-pay

## 2015-10-23 DIAGNOSIS — R1032 Left lower quadrant pain: Secondary | ICD-10-CM

## 2015-11-06 ENCOUNTER — Encounter: Payer: Self-pay | Admitting: *Deleted

## 2015-11-06 NOTE — Progress Notes (Signed)
Patient has seen Dr. Greggory Keenefrancesco and had a benign pelvic ultrasound.  She will follow-up as indicated by Dr. Greggory Keenefrancesco.  Next mammogram will be due in one year.  [facility] to [place].

## 2016-02-17 ENCOUNTER — Emergency Department
Admission: EM | Admit: 2016-02-17 | Discharge: 2016-02-17 | Disposition: A | Discharge status: Home / Self Care | Payer: Self-pay | Attending: Emergency Medicine | Admitting: Emergency Medicine

## 2016-02-17 ENCOUNTER — Encounter: Payer: Self-pay | Admitting: Emergency Medicine

## 2016-02-17 ENCOUNTER — Emergency Department: Payer: Self-pay

## 2016-02-17 DIAGNOSIS — J441 Chronic obstructive pulmonary disease with (acute) exacerbation: Secondary | ICD-10-CM | POA: Insufficient documentation

## 2016-02-17 DIAGNOSIS — Z79899 Other long term (current) drug therapy: Secondary | ICD-10-CM | POA: Insufficient documentation

## 2016-02-17 DIAGNOSIS — F17210 Nicotine dependence, cigarettes, uncomplicated: Secondary | ICD-10-CM | POA: Insufficient documentation

## 2016-02-17 DIAGNOSIS — J45909 Unspecified asthma, uncomplicated: Secondary | ICD-10-CM | POA: Insufficient documentation

## 2016-02-17 LAB — CBC WITH DIFFERENTIAL/PLATELET
Basophils Absolute: 0.1 10*3/uL (ref 0–0.1)
Basophils Relative: 1 %
Eosinophils Absolute: 0.2 10*3/uL (ref 0–0.7)
Eosinophils Relative: 3 %
HCT: 48.5 % — ABNORMAL HIGH (ref 35.0–47.0)
Hemoglobin: 16.9 g/dL — ABNORMAL HIGH (ref 12.0–16.0)
Lymphocytes Relative: 58 %
Lymphs Abs: 4.6 10*3/uL — ABNORMAL HIGH (ref 1.0–3.6)
MCH: 34.8 pg — ABNORMAL HIGH (ref 26.0–34.0)
MCHC: 34.8 g/dL (ref 32.0–36.0)
MCV: 100.2 fL — ABNORMAL HIGH (ref 80.0–100.0)
Monocytes Absolute: 0.6 10*3/uL (ref 0.2–0.9)
Monocytes Relative: 7 %
Neutro Abs: 2.5 10*3/uL (ref 1.4–6.5)
Neutrophils Relative %: 31 %
Platelets: 331 10*3/uL (ref 150–440)
RBC: 4.85 MIL/uL (ref 3.80–5.20)
RDW: 11.9 % (ref 11.5–14.5)
WBC: 8 10*3/uL (ref 3.6–11.0)

## 2016-02-17 LAB — BASIC METABOLIC PANEL
Anion gap: 14 (ref 5–15)
BUN: 7 mg/dL (ref 6–20)
CO2: 21 mmol/L — ABNORMAL LOW (ref 22–32)
Calcium: 8.5 mg/dL — ABNORMAL LOW (ref 8.9–10.3)
Chloride: 105 mmol/L (ref 101–111)
Creatinine, Ser: 0.69 mg/dL (ref 0.44–1.00)
GFR calc Af Amer: >60 mL/min (ref >60)
GFR calc non Af Amer: >60 mL/min (ref >60)
Glucose, Bld: 116 mg/dL — ABNORMAL HIGH (ref 65–99)
Potassium: 3.4 mmol/L — ABNORMAL LOW (ref 3.5–5.1)
Sodium: 140 mmol/L (ref 135–145)

## 2016-02-17 LAB — INFLUENZA PANEL BY PCR (TYPE A & B)
Influenza A By PCR: NEGATIVE
Influenza B By PCR: NEGATIVE

## 2016-02-17 LAB — TROPONIN I
Troponin I: <0.03 ng/mL (ref <0.03)
Troponin I: <0.03 ng/mL (ref <0.03)

## 2016-02-17 MED ORDER — PREDNISONE 20 MG PO TABS: 60.0000 mg | ORAL_TABLET | Freq: Every day | ORAL | 0 refills | Status: AC

## 2016-02-17 MED ORDER — ALBUTEROL SULFATE HFA 108 (90 BASE) MCG/ACT IN AERS: 2.0000 | INHALATION_SPRAY | Freq: Four times a day (QID) | RESPIRATORY_TRACT | 2 refills | Status: DC | PRN

## 2016-02-17 MED ORDER — AZITHROMYCIN 250 MG PO TABS
500.0000 mg | ORAL_TABLET | Freq: Once | ORAL | Status: AC
  Administered 2016-02-17: 500 mg via ORAL

## 2016-02-17 MED ORDER — METHYLPREDNISOLONE SODIUM SUCC 125 MG IJ SOLR
125.0000 mg | Freq: Once | INTRAMUSCULAR | Status: AC
  Administered 2016-02-17: 125 mg via INTRAVENOUS
  Filled 2016-02-17: qty 2

## 2016-02-17 MED ORDER — HYDROCOD POLST-CPM POLST ER 10-8 MG/5ML PO SUER
ORAL | Status: AC
  Filled 2016-02-17: qty 5

## 2016-02-17 MED ORDER — IPRATROPIUM-ALBUTEROL 0.5-2.5 (3) MG/3ML IN SOLN
RESPIRATORY_TRACT | Status: AC
  Filled 2016-02-17: qty 9

## 2016-02-17 MED ORDER — AZITHROMYCIN 250 MG PO TABS: ORAL_TABLET | ORAL | 0 refills | Status: DC

## 2016-02-17 MED ORDER — METHYLPREDNISOLONE SODIUM SUCC 125 MG IJ SOLR
INTRAMUSCULAR | Status: AC
  Filled 2016-02-17: qty 2

## 2016-02-17 MED ORDER — HYDROCOD POLST-CPM POLST ER 10-8 MG/5ML PO SUER
5.0000 mL | Freq: Once | ORAL | Status: AC
  Administered 2016-02-17: 5 mL via ORAL
  Filled 2016-02-17: qty 5

## 2016-02-17 MED ORDER — ALBUTEROL SULFATE HFA 108 (90 BASE) MCG/ACT IN AERS
2.0000 | INHALATION_SPRAY | RESPIRATORY_TRACT | Status: DC
  Filled 2016-02-17: qty 6.7

## 2016-02-17 MED ORDER — IPRATROPIUM-ALBUTEROL 0.5-2.5 (3) MG/3ML IN SOLN
3.0000 mL | Freq: Once | RESPIRATORY_TRACT | Status: AC
  Administered 2016-02-17: 3 mL via RESPIRATORY_TRACT
  Filled 2016-02-17: qty 3

## 2016-02-17 MED ORDER — IPRATROPIUM-ALBUTEROL 0.5-2.5 (3) MG/3ML IN SOLN
3.0000 mL | Freq: Once | RESPIRATORY_TRACT | Status: AC
Start: 2016-02-17 — End: 2016-02-17
  Administered 2016-02-17: 3 mL via RESPIRATORY_TRACT
  Filled 2016-02-17: qty 3

## 2016-02-17 MED ORDER — AZITHROMYCIN 250 MG PO TABS
ORAL_TABLET | ORAL | Status: AC
  Filled 2016-02-17: qty 2

## 2016-02-17 NOTE — ED Triage Notes (Signed)
Pt comes into the ED via EMS from home c/o shortness of breath that has been going on for 1 month.  Patient presents in NAD at this time with even and unlabored respirations.  Patient came in today due to the shortness of breath getting worse.  Patient has cough that is creating yellow sputum.  No wheezing audible at this time.

## 2016-02-17 NOTE — ED Provider Notes (Signed)
[facility] Emergency Department Provider Note  ____________________________________________  Time seen: Approximately 6:08 PM  I have reviewed the triage vital signs and the nursing notes.   HISTORY  Chief Complaint Shortness of Breath   HPI Charlene Woods is a 45 y.o. female a history of active smoking, COPD who presents for evaluation of shortness of breath. Patient reports a month of a cough productive of yellow sputum. Today she started to have body aches, chills, wheezing, shortness of breath, chest tightness. She describes the chest tightness as centrally located worse with wheezing, non radiating, non pleuritic. No personal or family history of ischemic heart disease or blood clots, no hemoptysis, no leg pain or swelling, no recent travel or immobilization, no exogenous hormones. Patient reports that her shortness of breath is worse with exertion. She does not have a rescue inhaler and has only been using her steroid inhaler at home. She continues to smoke. Flu and pneumonia shots are up to date.  Past Medical History:  Diagnosis Date  . Alcohol abuse   . Anxiety   . Asthma   . Depression   . Teeth missing     Patient Active Problem List   Diagnosis Date Noted  . Left lower quadrant pain 10/18/2015    Past Surgical History:  Procedure Laterality Date  . TUBAL LIGATION      Prior to Admission medications   Medication Sig Start Date End Date Taking? Authorizing Provider  albuterol (PROVENTIL HFA;VENTOLIN HFA) 108 (90 Base) MCG/ACT inhaler Inhale 2 puffs into the lungs every 6 (six) hours as needed for wheezing or shortness of breath. 02/17/16   Nita Sicklearolina Yaseen Gilberg, MD  azithromycin (ZITHROMAX) 250 MG tablet Take 1 a day for 4 days 02/17/16   Nita Sicklearolina Kreg Earhart, MD  diphenhydrAMINE (BENADRYL) 25 MG tablet Take 1 tablet (25 mg total) by mouth every 6 (six) hours. 06/13/15   Tammy Triplett, PA-C  predniSONE (DELTASONE) 20 MG tablet Take 3 tablets  (60 mg total) by mouth daily. 02/17/16 02/21/16  Nita Sicklearolina Buffi Ewton, MD    Allergies Aspirin; Bee pollen; Codeine; Morphine and related; and Penicillins  Family History  Problem Relation Age of Onset  . Breast cancer Mother 3139  . Diabetes Mother   . Ovarian cancer Neg Hx   . Colon cancer Neg Hx     Social History Social History  Substance Use Topics  . Smoking status: Current Every Day Smoker    Packs/day: 0.05    Types: Cigarettes  . Smokeless tobacco: Never Used  . Alcohol use Yes     Comment: 4-5 qd     Review of Systems  Constitutional: Negative for fever. + chills and body aches Eyes: Negative for visual changes. ENT: Negative for sore throat. Neck: No neck pain  Cardiovascular: + chest tightness Respiratory: +shortness of breath and cough Gastrointestinal: Negative for abdominal pain, vomiting or diarrhea. Genitourinary: Negative for dysuria. Musculoskeletal: Negative for back pain. Skin: Negative for rash. Neurological: Negative for headaches, weakness or numbness. Psych: No SI or HI  ____________________________________________   PHYSICAL EXAM:  VITAL SIGNS: ED Triage Vitals  Enc Vitals Group     BP 02/17/16 1704 108/86     Pulse Rate 02/17/16 1704 71     Resp 02/17/16 1704 20     Temp 02/17/16 1704 97.6 F (36.4 C)     Temp Source 02/17/16 1704 Oral     SpO2 02/17/16 1704 99 %     Weight 02/17/16 1705 120 lb (54.4  kg)     Height 02/17/16 1705 5\' 3"  (1.6 m)     Head Circumference --      Peak Flow --      Pain Score --      Pain Loc --      Pain Edu? --      Excl. in GC? --     Constitutional: Alert and oriented. Well appearing and in no apparent distress. HEENT:      Head: Normocephalic and atraumatic.         Eyes: Conjunctivae are normal. Sclera is non-icteric. EOMI. PERRL      Mouth/Throat: Mucous membranes are moist.       Neck: Supple with no signs of meningismus. Cardiovascular: Regular rate and rhythm. No murmurs, gallops, or rubs.  2+ symmetrical distal pulses are present in all extremities. No JVD. Respiratory: Tachypneic, normal sats on RA, decreased air movement, no crackles or wheezes. Gastrointestinal: Soft, non tender, and non distended with positive bowel sounds. No rebound or guarding. Musculoskeletal: Nontender with normal range of motion in all extremities. No edema, cyanosis, or erythema of extremities. Neurologic: Normal speech and language. Face is symmetric. Moving all extremities. No gross focal neurologic deficits are appreciated. Skin: Skin is warm, dry and intact. No rash noted. Psychiatric: Mood and affect are normal. Speech and behavior are normal.  ____________________________________________   LABS (all labs ordered are listed, but only abnormal results are displayed)  Labs Reviewed  CBC WITH DIFFERENTIAL/PLATELET - Abnormal; Notable for the following:       Result Value   Hemoglobin 16.9 (*)    HCT 48.5 (*)    MCV 100.2 (*)    MCH 34.8 (*)    Lymphs Abs 4.6 (*)    All other components within normal limits  BASIC METABOLIC PANEL - Abnormal; Notable for the following:    Potassium 3.4 (*)    CO2 21 (*)    Glucose, Bld 116 (*)    Calcium 8.5 (*)    All other components within normal limits  INFLUENZA PANEL BY PCR (TYPE A & B, H1N1)  TROPONIN I  TROPONIN I   ____________________________________________  EKG  ED ECG REPORT I, Nita Sicklearolina Torrell Krutz, the attending physician, personally viewed and interpreted this ECG.  Normal sinus rhythm, rate of 62, normal intervals, normal axis, no ST elevations or depressions. No prior for comparison ____________________________________________  RADIOLOGY  CXR:  No infiltrate or pulmonary edema. Mild hyperinflation. Minimal perihilar bronchitic changes. ____________________________________________   PROCEDURES  Procedure(s) performed: None Procedures Critical Care performed:  None ____________________________________________   INITIAL  IMPRESSION / ASSESSMENT AND PLAN / ED COURSE  45 y.o. female a history of active smoking, COPD who presents for evaluation of shortness of breath, wheezing, productive cough, and chills. Patient has tachypnea, maintaining sats, speaking full sentences, has mildly decreased air movement with no crackles or wheezes. Vital signs are within normal limits. EKG with no evidence of ischemia. Presentation concerning for community acquired pneumonia versus influenza versus viral URI causing a COPD exacerbation. We'll check labs, and cycle cardiac enzymes. We'll give duoneb, Solu-Medrol, and start patient on abx.   Clinical Course as of Feb 18 955  Charlene LinkSun Feb 17, 2016  1954 Patient received 2 DuoNeb treatments and she feels markedly improved. She is moving great air with no wheezing. Sats remained normal. She continues to have significant coughing, we'll give Tussionex. Flu is negative. Chest x-ray with no infiltrate. Labs are pending.  [CV]  2204 2nd troponin is  negative. Patient remains well appearing and no distress, moving good air. Will dc home on azithromycin, prednisone, albuterol inhaler and close f/u with PCP>  [CV]    Clinical Course User Index [CV] Nita Sickle, MD    Pertinent labs & imaging results that were available during my care of the patient were reviewed by me and considered in my medical decision making (see chart for details).    ____________________________________________   FINAL CLINICAL IMPRESSION(S) / ED DIAGNOSES  Final diagnoses:  COPD exacerbation (HCC)      NEW MEDICATIONS STARTED DURING THIS VISIT:  Discharge Medication List as of 02/17/2016 10:05 PM    START taking these medications   Details  azithromycin (ZITHROMAX) 250 MG tablet Take 1 a day for 4 days, Print    predniSONE (DELTASONE) 20 MG tablet Take 3 tablets (60 mg total) by mouth daily., Starting Sun 02/17/2016, Until Thu 02/21/2016, Print         Note:  This document was prepared using  Dragon voice recognition software and may include unintentional dictation errors.    Nita Sickle, MD 02/18/16 [phone]

## 2016-02-17 NOTE — ED Notes (Signed)
Lab redrawn and sent

## 2016-03-04 ENCOUNTER — Emergency Department: Payer: Self-pay

## 2016-03-04 ENCOUNTER — Emergency Department
Admission: EM | Admit: 2016-03-04 | Discharge: 2016-03-04 | Disposition: A | Discharge status: Home / Self Care | Payer: Self-pay | Attending: Emergency Medicine | Admitting: Emergency Medicine

## 2016-03-04 ENCOUNTER — Encounter: Payer: Self-pay | Admitting: Medical Oncology

## 2016-03-04 DIAGNOSIS — Z79899 Other long term (current) drug therapy: Secondary | ICD-10-CM | POA: Insufficient documentation

## 2016-03-04 DIAGNOSIS — J069 Acute upper respiratory infection, unspecified: Secondary | ICD-10-CM | POA: Insufficient documentation

## 2016-03-04 DIAGNOSIS — J181 Lobar pneumonia, unspecified organism: Secondary | ICD-10-CM | POA: Insufficient documentation

## 2016-03-04 DIAGNOSIS — J189 Pneumonia, unspecified organism: Secondary | ICD-10-CM

## 2016-03-04 DIAGNOSIS — F17210 Nicotine dependence, cigarettes, uncomplicated: Secondary | ICD-10-CM | POA: Insufficient documentation

## 2016-03-04 DIAGNOSIS — J45909 Unspecified asthma, uncomplicated: Secondary | ICD-10-CM | POA: Insufficient documentation

## 2016-03-04 LAB — CBC
HCT: 47.3 % — ABNORMAL HIGH (ref 35.0–47.0)
Hemoglobin: 16.4 g/dL — ABNORMAL HIGH (ref 12.0–16.0)
MCH: 34.7 pg — ABNORMAL HIGH (ref 26.0–34.0)
MCHC: 34.6 g/dL (ref 32.0–36.0)
MCV: 100.4 fL — ABNORMAL HIGH (ref 80.0–100.0)
Platelets: 236 10*3/uL (ref 150–440)
RBC: 4.71 MIL/uL (ref 3.80–5.20)
RDW: 12.4 % (ref 11.5–14.5)
WBC: 6.5 10*3/uL (ref 3.6–11.0)

## 2016-03-04 LAB — URINALYSIS, COMPLETE (UACMP) WITH MICROSCOPIC
Bacteria, UA: NONE SEEN
Bilirubin Urine: NEGATIVE
Glucose, UA: NEGATIVE mg/dL
Hgb urine dipstick: NEGATIVE
Ketones, ur: NEGATIVE mg/dL
Leukocytes, UA: NEGATIVE
Nitrite: NEGATIVE
Protein, ur: NEGATIVE mg/dL
RBC / HPF: NONE SEEN RBC/hpf (ref 0–5)
Specific Gravity, Urine: 1.001 — ABNORMAL LOW (ref 1.005–1.030)
Squamous Epithelial / LPF: NONE SEEN
WBC, UA: NONE SEEN WBC/hpf (ref 0–5)
pH: 5 (ref 5.0–8.0)

## 2016-03-04 LAB — COMPREHENSIVE METABOLIC PANEL
ALT: 31 U/L (ref 14–54)
AST: 44 U/L — ABNORMAL HIGH (ref 15–41)
Albumin: 4.2 g/dL (ref 3.5–5.0)
Alkaline Phosphatase: 48 U/L (ref 38–126)
Anion gap: 11 (ref 5–15)
BUN: 8 mg/dL (ref 6–20)
CO2: 19 mmol/L — ABNORMAL LOW (ref 22–32)
Calcium: 8.9 mg/dL (ref 8.9–10.3)
Chloride: 109 mmol/L (ref 101–111)
Creatinine, Ser: 0.71 mg/dL (ref 0.44–1.00)
GFR calc Af Amer: >60 mL/min (ref >60)
GFR calc non Af Amer: >60 mL/min (ref >60)
Glucose, Bld: 76 mg/dL (ref 65–99)
Potassium: 3.8 mmol/L (ref 3.5–5.1)
Sodium: 139 mmol/L (ref 135–145)
Total Bilirubin: 0.9 mg/dL (ref 0.3–1.2)
Total Protein: 7.4 g/dL (ref 6.5–8.1)

## 2016-03-04 MED ORDER — LEVOFLOXACIN 750 MG PO TABS
ORAL_TABLET | ORAL | Status: AC
  Administered 2016-03-04: 750 mg via ORAL
  Filled 2016-03-04: qty 1

## 2016-03-04 MED ORDER — BENZONATATE 100 MG PO CAPS
200.0000 mg | ORAL_CAPSULE | Freq: Once | ORAL | Status: AC
  Administered 2016-03-04: 200 mg via ORAL
  Filled 2016-03-04 (×2): qty 2

## 2016-03-04 MED ORDER — IPRATROPIUM-ALBUTEROL 0.5-2.5 (3) MG/3ML IN SOLN
3.0000 mL | Freq: Once | RESPIRATORY_TRACT | Status: AC
  Administered 2016-03-04: 3 mL via RESPIRATORY_TRACT
  Filled 2016-03-04 (×2): qty 3

## 2016-03-04 MED ORDER — LEVOFLOXACIN 750 MG PO TABS
750.0000 mg | ORAL_TABLET | Freq: Once | ORAL | Status: AC
  Administered 2016-03-04: 750 mg via ORAL

## 2016-03-04 MED ORDER — LEVOFLOXACIN 750 MG PO TABS: 750.0000 mg | ORAL_TABLET | Freq: Every day | ORAL | 0 refills | Status: AC

## 2016-03-04 NOTE — ED Notes (Signed)
Pt to ED with c/o vomiting, diarrhea and cough x 2 days. Pt seen here 2 weeks ago for COPD exacerbation. Pt reports she has been living in a tent recently. Skin warm and dry, pt denies abd pain, eating chips and drinking at this time. Appears in no acute distress.

## 2016-03-04 NOTE — ED Provider Notes (Signed)
[facility] Emergency Department Provider Note  Time seen: 2:34 PM  I have reviewed the triage vital signs and the nursing notes.   HISTORY  Chief Complaint Emesis; Diarrhea; and Cough    HPI Charlene Woods is a 46 y.o. female with a past medical history of alcohol abuse, anxiety, asthma, depression, presents to the emergency department with complaints of shortness of breath as well as nausea and vomiting. According to the patient for the past several weeks she has been feeling short of breath. For the past 3 days she has been nauseated with occasional episodes of vomiting and diarrhea. States occasional cough, congestion. Denies fever. Denies any chest pain or abdominal pain. Patient has a history of COPD currently on Advair and albuterol.  Past Medical History:  Diagnosis Date  . Alcohol abuse   . Anxiety   . Asthma   . Depression   . Teeth missing     Patient Active Problem List   Diagnosis Date Noted  . Left lower quadrant pain 10/18/2015    Past Surgical History:  Procedure Laterality Date  . TUBAL LIGATION      Prior to Admission medications   Medication Sig Start Date End Date Taking? Authorizing Provider  albuterol (PROVENTIL HFA;VENTOLIN HFA) 108 (90 Base) MCG/ACT inhaler Inhale 2 puffs into the lungs every 6 (six) hours as needed for wheezing or shortness of breath. 02/17/16   Nita Sicklearolina Veronese, MD  azithromycin (ZITHROMAX) 250 MG tablet Take 1 a day for 4 days 02/17/16   Nita Sicklearolina Veronese, MD  diphenhydrAMINE (BENADRYL) 25 MG tablet Take 1 tablet (25 mg total) by mouth every 6 (six) hours. 06/13/15   Tammy Triplett, PA-C    Allergies  Allergen Reactions  . Aspirin   . Bee Pollen   . Codeine   . Morphine And Related   . Penicillins     Family History  Problem Relation Age of Onset  . Breast cancer Mother 239  . Diabetes Mother   . Ovarian cancer Neg Hx   . Colon cancer Neg Hx     Social History Social History  Substance  Use Topics  . Smoking status: Current Every Day Smoker    Packs/day: 0.05    Types: Cigarettes  . Smokeless tobacco: Never Used  . Alcohol use Yes     Comment: 4-5 qd     Review of Systems Constitutional: Negative for fever. Cardiovascular: Negative for chest pain. Respiratory: Positive for shortness of breath. Positive for cough. Gastrointestinal: Negative for abdominal pain. Positive for intermittent vomiting and diarrhea over the past 3 days. Musculoskeletal: Negative for back pain. Neurological: Negative for headache 10-point ROS otherwise negative.  ____________________________________________   PHYSICAL EXAM:  VITAL SIGNS: ED Triage Vitals  Enc Vitals Group     BP 03/04/16 1235 111/76     Pulse Rate 03/04/16 1235 64     Resp --      Temp 03/04/16 1235 99 F (37.2 C)     Temp Source 03/04/16 1235 Oral     SpO2 03/04/16 1235 95 %     Weight 03/04/16 1236 120 lb (54.4 kg)     Height 03/04/16 1236 5\' 3"  (1.6 m)     Head Circumference --      Peak Flow --      Pain Score --      Pain Loc --      Pain Edu? --      Excl. in GC? --  Constitutional: Alert and oriented. Well appearing and in no distress. Eyes: Normal exam ENT   Head: Normocephalic and atraumatic.   Mouth/Throat: Mucous membranes are moist.No oral lesions. Patient does have several lesions to her lips and face, possible herpes. Cardiovascular: Normal rate, regular rhythm. No murmur Respiratory: Normal respiratory effort without tachypnea nor retractions. Breath sounds are clear. Occasional coughing. Gastrointestinal: Soft and nontender. No distention Musculoskeletal: Nontender with normal range of motion in all extremities.  Neurologic:  Normal speech and language. No gross focal neurologic deficits Skin:  Skin is warm, dry and intact.  Psychiatric: Mood and affect are normal.   ____________________________________________     RADIOLOGY  Possible left lower lobe pneumonia versus  atelectasis.  ____________________________________________   INITIAL IMPRESSION / ASSESSMENT AND PLAN / ED COURSE  Pertinent labs & imaging results that were available during my care of the patient were reviewed by me and considered in my medical decision making (see chart for details).  Patient presents to the emergency department with cough, nausea, vomiting, diarrhea, shortness of breath. Overall the patient appears well, occasional cough during examination. Temperature of 99. White blood cell count is normal. Chest x-ray shows possible left lower lobe pneumonia. 95% oxygen saturation on room air. Vitals are largely within normal limits. Given the atelectasis versus pneumonia with the patient's complaint of cough with occasional vomiting we will cover with antibiotics. Patient is agreeable to this plan.  ____________________________________________   FINAL CLINICAL IMPRESSION(S) / ED DIAGNOSES  Pneumonia Dyspnea Emesis/diarrhea    Minna Antis, MD 03/04/16 1437

## 2016-03-04 NOTE — ED Triage Notes (Signed)
Pt to triage c/o vomiting, diarrhea and cough x 2 days. Pt seen here 2 weeks ago for copd. Pt states she has been staying outside in tent. Pt eating chips in triage.

## 2016-03-04 NOTE — Discharge Instructions (Signed)
Please take your antibiotics as prescribed for their entire course. Please follow-up with your primary care doctor in 2 days for recheck/reevaluation. Return to the emergency department for any worsening shortness of breath, chest pain, or any other symptom personally concerning to yourself.

## 2016-08-20 ENCOUNTER — Ambulatory Visit: Payer: Self-pay | Attending: Oncology

## 2016-11-05 ENCOUNTER — Ambulatory Visit: Payer: Self-pay | Attending: Oncology

## 2016-11-05 ENCOUNTER — Encounter (INDEPENDENT_AMBULATORY_CARE_PROVIDER_SITE_OTHER): Payer: Self-pay

## 2016-11-05 ENCOUNTER — Ambulatory Visit
Admission: RE | Admit: 2016-11-05 | Discharge: 2016-11-05 | Disposition: A | Discharge status: Home / Self Care | Payer: Self-pay | Source: Ambulatory Visit | Attending: Oncology | Admitting: Oncology

## 2016-11-05 VITALS — BP 100/69 | HR 67 | Temp 99.4°F | Ht 64.0 in | Wt 131.0 lb

## 2016-11-05 DIAGNOSIS — Z0000 Encounter for general adult medical examination without abnormal findings: Secondary | ICD-10-CM

## 2016-11-05 NOTE — Progress Notes (Signed)
Subjective:     Patient ID: Charlene Woods, female   DOB: October 24, 1970, 46 y.o.   MRN: [id number]  HPI   Review of Systems     Objective:   Physical Exam  Pulmonary/Chest: Right breast exhibits no inverted nipple, no mass, no nipple discharge, no skin change and no tenderness. Left breast exhibits no inverted nipple, no mass, no nipple discharge, no skin change and no tenderness. Breasts are symmetrical.       Assessment:     46 [facility].  Patient screened, and meets [facility] eligibility.  Patient does not have insurance, Medicare or Medicaid.  Handout given on Affordable Care Act.  Instructed patient on breast self-exam using teach back method.  CBE unremarkable.  No mass or lump palpated.  Patient reports she is staying in a tent.  She receives mail at a friends house.    Plan:     Sent for bilateral screening mammogram.

## 2016-11-06 ENCOUNTER — Other Ambulatory Visit: Payer: Self-pay

## 2016-11-06 DIAGNOSIS — N63 Unspecified lump in unspecified breast: Secondary | ICD-10-CM

## 2016-11-18 ENCOUNTER — Ambulatory Visit
Admission: RE | Admit: 2016-11-18 | Discharge: 2016-11-18 | Disposition: A | Discharge status: Home / Self Care | Payer: Self-pay | Source: Ambulatory Visit | Attending: Oncology | Admitting: Oncology

## 2016-11-18 DIAGNOSIS — N63 Unspecified lump in unspecified breast: Secondary | ICD-10-CM

## 2016-11-18 NOTE — Progress Notes (Signed)
Letter mailed from [facility] to notify of normal mammogram results.  Patient to return in one year for annual screening.  Copy to [facility]. 

## 2017-07-10 ENCOUNTER — Emergency Department: Payer: Self-pay

## 2017-07-10 ENCOUNTER — Other Ambulatory Visit: Payer: Self-pay

## 2017-07-10 ENCOUNTER — Emergency Department
Admission: EM | Admit: 2017-07-10 | Discharge: 2017-07-10 | Disposition: A | Discharge status: Home / Self Care | Payer: Self-pay | Attending: Emergency Medicine | Admitting: Emergency Medicine

## 2017-07-10 ENCOUNTER — Encounter: Payer: Self-pay | Admitting: Emergency Medicine

## 2017-07-10 DIAGNOSIS — L299 Pruritus, unspecified: Secondary | ICD-10-CM | POA: Insufficient documentation

## 2017-07-10 DIAGNOSIS — J45909 Unspecified asthma, uncomplicated: Secondary | ICD-10-CM | POA: Insufficient documentation

## 2017-07-10 DIAGNOSIS — N939 Abnormal uterine and vaginal bleeding, unspecified: Secondary | ICD-10-CM

## 2017-07-10 DIAGNOSIS — F17210 Nicotine dependence, cigarettes, uncomplicated: Secondary | ICD-10-CM | POA: Insufficient documentation

## 2017-07-10 DIAGNOSIS — W57XXXA Bitten or stung by nonvenomous insect and other nonvenomous arthropods, initial encounter: Secondary | ICD-10-CM

## 2017-07-10 DIAGNOSIS — R42 Dizziness and giddiness: Secondary | ICD-10-CM | POA: Insufficient documentation

## 2017-07-10 DIAGNOSIS — Z79899 Other long term (current) drug therapy: Secondary | ICD-10-CM | POA: Insufficient documentation

## 2017-07-10 LAB — BASIC METABOLIC PANEL
Anion gap: 7 (ref 5–15)
BUN: 5 mg/dL — ABNORMAL LOW (ref 6–20)
CO2: 24 mmol/L (ref 22–32)
Calcium: 8.4 mg/dL — ABNORMAL LOW (ref 8.9–10.3)
Chloride: 101 mmol/L (ref 101–111)
Creatinine, Ser: 0.78 mg/dL (ref 0.44–1.00)
GFR calc Af Amer: >60 mL/min (ref >60)
GFR calc non Af Amer: >60 mL/min (ref >60)
Glucose, Bld: 75 mg/dL (ref 65–99)
Potassium: 3.4 mmol/L — ABNORMAL LOW (ref 3.5–5.1)
Sodium: 132 mmol/L — ABNORMAL LOW (ref 135–145)

## 2017-07-10 LAB — CBC WITH DIFFERENTIAL/PLATELET
Basophils Absolute: 0.1 10*3/uL (ref 0–0.1)
Basophils Relative: 1 %
Eosinophils Absolute: 0.3 10*3/uL (ref 0–0.7)
Eosinophils Relative: 3 %
HCT: 40.9 % (ref 35.0–47.0)
Hemoglobin: 14.4 g/dL (ref 12.0–16.0)
Lymphocytes Relative: 23 %
Lymphs Abs: 2 10*3/uL (ref 1.0–3.6)
MCH: 35.7 pg — ABNORMAL HIGH (ref 26.0–34.0)
MCHC: 35.2 g/dL (ref 32.0–36.0)
MCV: 101.5 fL — ABNORMAL HIGH (ref 80.0–100.0)
Monocytes Absolute: 1.1 10*3/uL — ABNORMAL HIGH (ref 0.2–0.9)
Monocytes Relative: 13 %
Neutro Abs: 5.2 10*3/uL (ref 1.4–6.5)
Neutrophils Relative %: 60 %
Platelets: 308 10*3/uL (ref 150–440)
RBC: 4.02 MIL/uL (ref 3.80–5.20)
RDW: 12.1 % (ref 11.5–14.5)
WBC: 8.7 10*3/uL (ref 3.6–11.0)

## 2017-07-10 LAB — POCT PREGNANCY, URINE: Preg Test, Ur: NEGATIVE

## 2017-07-10 MED ORDER — TRIAMCINOLONE ACETONIDE 0.5 % EX OINT: 1.0000 "application " | TOPICAL_OINTMENT | Freq: Two times a day (BID) | CUTANEOUS | 0 refills | Status: DC

## 2017-07-10 NOTE — ED Notes (Signed)
Pt reports for the past 3 days she has been having increased menstrual bleeding with dark colored clots. Denies pain.

## 2017-07-10 NOTE — ED Triage Notes (Signed)
Pt to ED via POV stating that she is currently on her cycle and that she is passing a lot of blood clots. Pt states that this is not normal for her during her cycle. Pt is in NAD at this time. Pt denies abdominal pain at this time.

## 2017-07-10 NOTE — ED Provider Notes (Signed)
[facility] Emergency Department Provider Note  ____________________________________________   I have reviewed the triage vital signs and the nursing notes.   HISTORY  Chief Complaint Vaginal Bleeding   History limited by: Not Limited   HPI Charlene Woods is a 47 y.o. female who presents to the emergency department today because of concern for abnormal vaginal bleeding. Patient states that she has not gone through menopause yet but for the past three days has noticed abnormal bleeding. She has noticed dark red almost black blood clots which is unusual for her. She denies any unusual pain. She does state she has been dizzy. Denies any known history of fibroids for her or her family. Secondary complaint of possible bed bug bites. States she did notice some bugs and they have tried sprays but she continues to itch.     Per medical record review patient has a history of tubal ligation.  Past Medical History:  Diagnosis Date  . Alcohol abuse   . Anxiety   . Asthma   . Depression   . Teeth missing     Patient Active Problem List   Diagnosis Date Noted  . Left lower quadrant pain 10/18/2015    Past Surgical History:  Procedure Laterality Date  . TUBAL LIGATION      Prior to Admission medications   Medication Sig Start Date End Date Taking? Authorizing Provider  albuterol (PROVENTIL HFA;VENTOLIN HFA) 108 (90 Base) MCG/ACT inhaler Inhale 2 puffs into the lungs every 6 (six) hours as needed for wheezing or shortness of breath. 02/17/16   Nita Sickle, MD  azithromycin [facility]) 250 MG tablet Take 1 a day for 4 days 02/17/16   Don Perking, [place], MD  diphenhydrAMINE (BENADRYL) 25 MG tablet Take 1 tablet (25 mg total) by mouth every 6 (six) hours. 06/13/15   Triplett, Tammy, PA-C    Allergies Aspirin; Bee pollen; Codeine; Morphine and related; and Penicillins  Family History  Problem Relation Age of Onset  . Breast cancer Mother 34  .  Diabetes Mother   . Ovarian cancer Neg Hx   . Colon cancer Neg Hx     Social History Social History   Tobacco Use  . Smoking status: Current Every Day Smoker    Packs/day: 0.05    Types: Cigarettes  . Smokeless tobacco: Never Used  Substance Use Topics  . Alcohol use: Yes    Comment: 4-5 qd   . Drug use: No    Review of Systems Constitutional: No fever/chills Eyes: No visual changes. ENT: No sore throat. Cardiovascular: Denies chest pain. Respiratory: Denies shortness of breath. Gastrointestinal: No abdominal pain.  No nausea, no vomiting.  No diarrhea.   Genitourinary: positive for abdominal vaginal bleeding. Musculoskeletal: Negative for back pain. Skin: Positive for pruritis and bites.  Neurological: Negative for headaches, focal weakness or numbness.  ____________________________________________   PHYSICAL EXAM:  VITAL SIGNS: ED Triage Vitals  Enc Vitals Group     BP 07/10/17 1034 101/67     Pulse Rate 07/10/17 1034 62     Resp 07/10/17 1034 16     Temp 07/10/17 1034 97.9 F (36.6 C)     Temp src --      SpO2 07/10/17 1034 98 %     Weight 07/10/17 1033 131 lb (59.4 kg)     Height 07/10/17 1033  (1.6 m)     Head Circumference --      Peak Flow --      Pain Score  07/10/17 1033 0   Constitutional: Alert and oriented. Well appearing and in no distress. Eyes: Conjunctivae are normal.  ENT   Head: Normocephalic and atraumatic.   Nose: No congestion/rhinnorhea.   Mouth/Throat: Mucous membranes are moist.   Neck: No stridor. Hematological/Lymphatic/Immunilogical: No cervical lymphadenopathy. Cardiovascular: Normal rate, regular rhythm.  No murmurs, rubs, or gallops. Respiratory: Normal respiratory effort without tachypnea nor retractions. Breath sounds are clear and equal bilaterally. No wheezes/rales/rhonchi. Gastrointestinal: Soft and non tender. No rebound. No guarding.  Genitourinary: Deferred Musculoskeletal: Normal range of motion in  all extremities. No lower extremity edema. Neurologic:  Normal speech and language. No gross focal neurologic deficits are appreciated.  Skin:  Areas of excoriation on bilateral forearms.  Psychiatric: Mood and affect are normal. Speech and behavior are normal. Patient exhibits appropriate insight and judgment.  ____________________________________________    LABS (pertinent positives/negatives)  BMP na 132, k 3.4, cr 0.78 Upreg neg CBC wbc 8.7, hgb 14.4, plt 308  ____________________________________________   EKG  None  ____________________________________________    RADIOLOGY  US transvaginal No concerning findings  ____________________________________________   PROCEDURES  Procedures  ____________________________________________   INITIAL IMPRESSION / ASSESSMENT AND PLAN / ED COURSE  Pertinent labs & imaging results that were available during my care of the patient were reviewed by me and considered in my medical decision making (see chart for details).  Patient presented to the emergency department today with primary complaint of vaginal bleeding.  Patient had blood work obtained which did not show any concerning anemia.  Ultrasound was performed which did not show any masses or fibroids.  In addition the patient was complaining of bedbugs bites.  Will plan on giving triamcinolone cream to help with the pruritus.  Discussed importance of primary care follow-up.   ____________________________________________   FINAL CLINICAL IMPRESSION(S) / ED DIAGNOSES  Final diagnoses:  Abnormal vaginal bleeding  Insect bite, unspecified site, initial encounter     Note: This dictation was prepared with Dragon dictation. Any transcriptional errors that result from this process are unintentional     Phineas Semen, MD 07/10/17 1549

## 2017-07-10 NOTE — Discharge Instructions (Addendum)
Please seek medical attention for any high fevers, chest pain, shortness of breath, change in behavior, persistent vomiting, bloody stool or any other new or concerning symptoms.  

## 2017-07-17 ENCOUNTER — Other Ambulatory Visit: Payer: Self-pay

## 2017-07-17 ENCOUNTER — Emergency Department
Admission: EM | Admit: 2017-07-17 | Discharge: 2017-07-17 | Disposition: A | Discharge status: Home / Self Care | Payer: Self-pay | Attending: Emergency Medicine | Admitting: Emergency Medicine

## 2017-07-17 DIAGNOSIS — N939 Abnormal uterine and vaginal bleeding, unspecified: Secondary | ICD-10-CM

## 2017-07-17 DIAGNOSIS — Z87891 Personal history of nicotine dependence: Secondary | ICD-10-CM | POA: Insufficient documentation

## 2017-07-17 DIAGNOSIS — N938 Other specified abnormal uterine and vaginal bleeding: Secondary | ICD-10-CM | POA: Insufficient documentation

## 2017-07-17 DIAGNOSIS — J45909 Unspecified asthma, uncomplicated: Secondary | ICD-10-CM | POA: Insufficient documentation

## 2017-07-17 DIAGNOSIS — Z79899 Other long term (current) drug therapy: Secondary | ICD-10-CM | POA: Insufficient documentation

## 2017-07-17 DIAGNOSIS — I959 Hypotension, unspecified: Secondary | ICD-10-CM | POA: Insufficient documentation

## 2017-07-17 LAB — CBC
HCT: 38.6 % (ref 35.0–47.0)
Hemoglobin: 13.7 g/dL (ref 12.0–16.0)
MCH: 35.6 pg — ABNORMAL HIGH (ref 26.0–34.0)
MCHC: 35.6 g/dL (ref 32.0–36.0)
MCV: 100.2 fL — ABNORMAL HIGH (ref 80.0–100.0)
Platelets: 327 10*3/uL (ref 150–440)
RBC: 3.85 MIL/uL (ref 3.80–5.20)
RDW: 12.1 % (ref 11.5–14.5)
WBC: 6.5 10*3/uL (ref 3.6–11.0)

## 2017-07-17 LAB — COMPREHENSIVE METABOLIC PANEL
ALT: 39 U/L (ref 14–54)
AST: 48 U/L — ABNORMAL HIGH (ref 15–41)
Albumin: 3.6 g/dL (ref 3.5–5.0)
Alkaline Phosphatase: 57 U/L (ref 38–126)
Anion gap: 11 (ref 5–15)
BUN: 7 mg/dL (ref 6–20)
CO2: 20 mmol/L — ABNORMAL LOW (ref 22–32)
Calcium: 8.6 mg/dL — ABNORMAL LOW (ref 8.9–10.3)
Chloride: 99 mmol/L — ABNORMAL LOW (ref 101–111)
Creatinine, Ser: 0.74 mg/dL (ref 0.44–1.00)
GFR calc Af Amer: >60 mL/min (ref >60)
GFR calc non Af Amer: >60 mL/min (ref >60)
Glucose, Bld: 97 mg/dL (ref 65–99)
Potassium: 4 mmol/L (ref 3.5–5.1)
Sodium: 130 mmol/L — ABNORMAL LOW (ref 135–145)
Total Bilirubin: 0.5 mg/dL (ref 0.3–1.2)
Total Protein: 6.6 g/dL (ref 6.5–8.1)

## 2017-07-17 MED ORDER — SODIUM CHLORIDE 0.9 % IV BOLUS
1000.0000 mL | Freq: Once | INTRAVENOUS | Status: AC
  Administered 2017-07-17: 1000 mL via INTRAVENOUS

## 2017-07-17 NOTE — Discharge Instructions (Addendum)
You should discontinue your blood pressure medication for the next several days until you follow-up with the doctor on Tuesday.  Return to the ER for new, worsening, or persistent vaginal bleeding or other abnormal bleeding, weakness or lightheadedness, difficulty breathing, abdominal pain, or any other new or worsening symptoms that concern you.

## 2017-07-17 NOTE — ED Notes (Signed)
Pelvic exam completed with Dr Marisa Severin. No swabs taken. Pt up to the bedside commode. Pt given a sandwich tray, juice and soda.

## 2017-07-17 NOTE — ED Notes (Addendum)
Pt rings call bell due to monitor making "beeping noise" this tech noticed that pt was hypotensive at 87/64, pt given 2 bags of NS which this tech disconnected due to both bags being completed. RN & MD notified.

## 2017-07-17 NOTE — ED Provider Notes (Addendum)
[facility] Emergency Department Provider Note ____________________________________________   First MD Initiated Contact with Patient 07/17/17 1439     (approximate)  I have reviewed the triage vital signs and the nursing notes.   HISTORY  Chief Complaint Vaginal Bleeding    HPI Charlene Woods is a 47 y.o. female with PMH as noted below who presents with vaginal bleeding, acute onset today, associated with lightheadedness and feeling like she would pass out, and now mostly subsided.  Patient denies associated abdominal pain, vaginal discharge, or urinary symptoms.  Patient states that she in the ED about a week ago for heavy vaginal bleeding with clots and was discharged home.  She then began having normal period bleeding which ended yesterday.  Past Medical History:  Diagnosis Date  . Alcohol abuse   . Anxiety   . Asthma   . Depression   . Teeth missing     Patient Active Problem List   Diagnosis Date Noted  . Left lower quadrant pain 10/18/2015    Past Surgical History:  Procedure Laterality Date  . TUBAL LIGATION      Prior to Admission medications   Medication Sig Start Date End Date Taking? Authorizing Provider  albuterol (PROVENTIL HFA;VENTOLIN HFA) 108 (90 Base) MCG/ACT inhaler Inhale 2 puffs into the lungs every 6 (six) hours as needed for wheezing or shortness of breath. 02/17/16   Nita Sickle, MD  diphenhydrAMINE (BENADRYL) 25 MG tablet Take 1 tablet (25 mg total) by mouth every 6 (six) hours. 06/13/15   Triplett, Tammy, PA-C  triamcinolone ointment (KENALOG) 0.5 % Apply 1 application topically 2 (two) times daily. 07/10/17   Phineas Semen, MD    Allergies Aspirin; Bee pollen; Codeine; Morphine and related; and Penicillins  Family History  Problem Relation Age of Onset  . Breast cancer Mother 27  . Diabetes Mother   . Ovarian cancer Neg Hx   . Colon cancer Neg Hx     Social History Social History   Tobacco Use    . Smoking status: Former Smoker    Packs/day: 0.05    Types: Cigarettes  . Smokeless tobacco: Never Used  Substance Use Topics  . Alcohol use: Yes    Comment: 4-5 qd   . Drug use: No    Review of Systems  Constitutional: No fever. Eyes: No visual changes. ENT: No sore throat. Cardiovascular: Denies chest pain. Respiratory: Denies shortness of breath. Gastrointestinal: No abdominal pain.  Genitourinary: Negative for dysuria or hematuria.  Musculoskeletal: Negative for back pain. Skin: Negative for rash. Neurological: Positive for lightheadedness.   ____________________________________________   PHYSICAL EXAM:  VITAL SIGNS: ED Triage Vitals  Enc Vitals Group     BP 07/17/17 1346 (!) 75/48     Pulse Rate 07/17/17 1346 85     Resp 07/17/17 1346 18     Temp 07/17/17 1346 98.2 F (36.8 C)     Temp Source 07/17/17 1346 Oral     SpO2 07/17/17 1346 98 %     Weight --      Height --      Head Circumference --      Peak Flow --      Pain Score 07/17/17 1344 0     Pain Loc --      Pain Edu? --      Excl. in GC? --     Constitutional: Alert and oriented. Well appearing and in no acute distress. Eyes: Conjunctivae are normal.  Head: Atraumatic.  Nose: No congestion/rhinnorhea. Mouth/Throat: Mucous membranes are slightly dry.   Neck: Normal range of motion.  Cardiovascular: Normal rate, regular rhythm. Grossly normal heart sounds.  Good peripheral circulation. Respiratory: Normal respiratory effort.  No retractions. Lungs CTAB. Gastrointestinal: Soft and nontender. No distention.  Genitourinary: Trace amount of blood in vaginal vault.  No acute hemorrhage.  No masses or tenderness.  No discharge. Musculoskeletal:  Extremities warm and well perfused.  Neurologic:  Normal speech and language. No gross focal neurologic deficits are appreciated.  Skin:  Skin is warm and dry. No rash noted. Psychiatric: Mood and affect are normal. Speech and behavior are  normal.  ____________________________________________   LABS (all labs ordered are listed, but only abnormal results are displayed)  Labs Reviewed  CBC - Abnormal; Notable for the following components:      Result Value   MCV 100.2 (*)    MCH 35.6 (*)    All other components within normal limits  COMPREHENSIVE METABOLIC PANEL - Abnormal; Notable for the following components:   Sodium 130 (*)    Chloride 99 (*)    CO2 20 (*)    Calcium 8.6 (*)    AST 48 (*)    All other components within normal limits  POC URINE PREG, ED  TYPE AND SCREEN   ____________________________________________  EKG  ED ECG REPORT I, Dionne Bucy, the attending physician, personally viewed and interpreted this ECG.  Date: 07/17/2017 EKG Time: 1353 Rate: 82 Rhythm: normal sinus rhythm QRS Axis: normal Intervals: normal ST/T Wave abnormalities: normal Narrative Interpretation: no evidence of acute ischemia  ____________________________________________  RADIOLOGY    ____________________________________________   PROCEDURES  Procedure(s) performed: No  Procedures  Critical Care performed: No ____________________________________________   INITIAL IMPRESSION / ASSESSMENT AND PLAN / ED COURSE  Pertinent labs & imaging results that were available during my care of the patient were reviewed by me and considered in my medical decision making (see chart for details).  47 year old female with PMH as noted above presents with an episode of vaginal bleeding today after ending her period yesterday.  She states that she was going through 2 pads per hour for a few hours, although now seems to have subsided.  She felt dizzy and near syncopal.  I reviewed the past medical records in Epic; the patient was seen in the ED on 07/10/2017 for vaginal bleeding and had negative labs and ultrasound at that time.  On exam, the patient is relatively well-appearing, and the abdomen is soft and nontender.   She was hypotensive in triage, however on reassessment her systolic blood pressure was over 100.  Pelvic exam is unremarkable, with trace amount of blood but no active bleeding.  Overall given the resolved bleeding and history of negative ultrasound 1 week ago, presentation is most consistent with benign cause such as dysfunctional uterine bleeding or possible perimenopausal bleeding.  No indication for repeat ultrasound today.  Will give fluids, and observe the patient.  Since she has no active bleeding, there is no indication for repeat CBC.  Consider further work-up if she has recurrent bleeding, hypotension, or near syncope.  ----------------------------------------- 5:38 PM on 07/17/2017 -----------------------------------------  Patient's blood pressures remained stable in the range of approximately 95/60.  She got up and walked around and is asymptomatic.  She has had no recurrence of bleeding.  At this time, the patient is stable for discharge home.  Again there is no indication for repeat CBC given that there is no active bleeding.  I  instructed the patient to temporarily discontinue her blood pressure medication.  She has follow-up with her primary care doctor in 4 days, so she can have the blood pressure rechecked at that time.  Return precautions given, and the patient expresses understanding.      ____________________________________________   FINAL CLINICAL IMPRESSION(S) / ED DIAGNOSES  Final diagnoses:  Vaginal bleeding  Hypotension, unspecified hypotension type      NEW MEDICATIONS STARTED DURING THIS VISIT:  New Prescriptions   No medications on file     Note:  This document was prepared using Dragon voice recognition software and may include unintentional dictation errors.    Dionne Bucy, MD 07/17/17 1739

## 2017-07-17 NOTE — ED Triage Notes (Signed)
Pt alert, oriented, ambulatory. States that she finished her menstrual cycle yesterday. States today noticed "large amount of darkish blood." states "I feel like I wanna pass out." states dizzy that began today as well. Going through 2 pads per hour. Hypotensive in triage. First BP 85/50, second BP 75/48.   Verbal order from Dr. Marisa Severin to start 2 L normal saline.

## 2018-05-14 ENCOUNTER — Emergency Department
Admission: EM | Admit: 2018-05-14 | Discharge: 2018-05-14 | Disposition: A | Discharge status: Home / Self Care | Payer: Medicaid Other | Attending: Emergency Medicine | Admitting: Emergency Medicine

## 2018-05-14 ENCOUNTER — Other Ambulatory Visit: Payer: Self-pay

## 2018-05-14 DIAGNOSIS — J45909 Unspecified asthma, uncomplicated: Secondary | ICD-10-CM | POA: Insufficient documentation

## 2018-05-14 DIAGNOSIS — F1914 Other psychoactive substance abuse with psychoactive substance-induced mood disorder: Secondary | ICD-10-CM | POA: Diagnosis not present

## 2018-05-14 DIAGNOSIS — F1994 Other psychoactive substance use, unspecified with psychoactive substance-induced mood disorder: Secondary | ICD-10-CM

## 2018-05-14 DIAGNOSIS — Z79899 Other long term (current) drug therapy: Secondary | ICD-10-CM | POA: Insufficient documentation

## 2018-05-14 DIAGNOSIS — F1010 Alcohol abuse, uncomplicated: Secondary | ICD-10-CM | POA: Insufficient documentation

## 2018-05-14 DIAGNOSIS — F17210 Nicotine dependence, cigarettes, uncomplicated: Secondary | ICD-10-CM | POA: Insufficient documentation

## 2018-05-14 DIAGNOSIS — Z046 Encounter for general psychiatric examination, requested by authority: Secondary | ICD-10-CM | POA: Diagnosis present

## 2018-05-14 LAB — CBC
HCT: 45.2 % (ref 36.0–46.0)
Hemoglobin: 15.3 g/dL — ABNORMAL HIGH (ref 12.0–15.0)
MCH: 34.7 pg — ABNORMAL HIGH (ref 26.0–34.0)
MCHC: 33.8 g/dL (ref 30.0–36.0)
MCV: 102.5 fL — ABNORMAL HIGH (ref 80.0–100.0)
Platelets: 400 10*3/uL (ref 150–400)
RBC: 4.41 MIL/uL (ref 3.87–5.11)
RDW: 11.8 % (ref 11.5–15.5)
WBC: 15.9 10*3/uL — ABNORMAL HIGH (ref 4.0–10.5)
nRBC: 0 % (ref 0.0–0.2)

## 2018-05-14 LAB — URINE DRUG SCREEN, QUALITATIVE (ARMC ONLY)
Amphetamines, Ur Screen: NOT DETECTED
Barbiturates, Ur Screen: NOT DETECTED
Benzodiazepine, Ur Scrn: NOT DETECTED
Cannabinoid 50 Ng, Ur ~~LOC~~: NOT DETECTED
Cocaine Metabolite,Ur ~~LOC~~: POSITIVE — AB
MDMA (Ecstasy)Ur Screen: NOT DETECTED
Methadone Scn, Ur: NOT DETECTED
Opiate, Ur Screen: NOT DETECTED
Phencyclidine (PCP) Ur S: NOT DETECTED
Tricyclic, Ur Screen: NOT DETECTED

## 2018-05-14 LAB — COMPREHENSIVE METABOLIC PANEL
ALT: 13 U/L (ref 0–44)
AST: 27 U/L (ref 15–41)
Albumin: 4.1 g/dL (ref 3.5–5.0)
Alkaline Phosphatase: 36 U/L — ABNORMAL LOW (ref 38–126)
Anion gap: 18 — ABNORMAL HIGH (ref 5–15)
BUN: 6 mg/dL (ref 6–20)
CO2: 12 mmol/L — ABNORMAL LOW (ref 22–32)
Calcium: 9.5 mg/dL (ref 8.9–10.3)
Chloride: 110 mmol/L (ref 98–111)
Creatinine, Ser: 1.08 mg/dL — ABNORMAL HIGH (ref 0.44–1.00)
GFR calc Af Amer: >60 mL/min (ref >60)
GFR calc non Af Amer: >60 mL/min (ref >60)
Glucose, Bld: 127 mg/dL — ABNORMAL HIGH (ref 70–99)
Potassium: 3.5 mmol/L (ref 3.5–5.1)
Sodium: 140 mmol/L (ref 135–145)
Total Bilirubin: 0.4 mg/dL (ref 0.3–1.2)
Total Protein: 8 g/dL (ref 6.5–8.1)

## 2018-05-14 LAB — ETHANOL: Alcohol, Ethyl (B): 191 mg/dL — ABNORMAL HIGH (ref <10)

## 2018-05-14 MED ORDER — SODIUM CHLORIDE 0.9 % IV SOLN
1000.0000 mL | Freq: Once | INTRAVENOUS | Status: AC
  Administered 2018-05-14: 1000 mL via INTRAVENOUS

## 2018-05-14 MED ORDER — LORAZEPAM 2 MG/ML IJ SOLN
0.5000 mg | Freq: Once | INTRAMUSCULAR | Status: AC
  Administered 2018-05-14: 0.5 mg via INTRAVENOUS
  Filled 2018-05-14: qty 1

## 2018-05-14 NOTE — ED Notes (Signed)
Patient given icecream and coke

## 2018-05-14 NOTE — ED Notes (Signed)
TTS Calvin at bedside.  

## 2018-05-14 NOTE — ED Notes (Signed)
Patient was given a pillow at this time.

## 2018-05-14 NOTE — ED Notes (Signed)
Patient given sandwich tray, sprite and remote control for tv

## 2018-05-14 NOTE — ED Notes (Signed)
Patient unhooked to use restroom and then 5 lead placed back on patient. Patient reports multiple times she wants IV out and does not want cardiac leads on. Patient did allow this RN to put leads back on at this time but refuses to wear pulse ox

## 2018-05-14 NOTE — Discharge Instructions (Addendum)
We recommend that you try to cut down or stop drinking.  [facility] may be able to help with this.  [facility] is another very good resource.  [facility] is free of course.  Please return here for any problems.

## 2018-05-14 NOTE — ED Notes (Signed)
Patient in room talking with family member.

## 2018-05-14 NOTE — ED Notes (Signed)
Patient refusing to give urine sample. Patient states "I am not peeing in a cup" Pt reports no reason, just is not going to. Patient also denies taking more than 1 blood pressure pill. Keeps asking to go home. MD Cyril Loosen made aware

## 2018-05-14 NOTE — ED Notes (Signed)
Handcuffs removed by officer in triage after explaining to pt the process of dressing out and blood draw. Pt states she will be cooperative to move process a long. Pt allowing Vet, EDT to draw blood at this time.

## 2018-05-14 NOTE — ED Notes (Signed)
Pt dressed by this RN and Vet EDT. Pt arrives with bag that contains cell phone, wallet, and tobacco. Jeans, tennis shoes, black jacket, black floral tank/shirt. Necklace. 2 rings. No earrings noted. Jewelry placed in container and labeled. All belongings placed in bag and labeled with pt information.   While attempting to dress pt out she begins to cry and states "ive done this for 30 years. y'all can put me in a mental institution. i'm not going home."

## 2018-05-14 NOTE — ED Provider Notes (Signed)
[facility] Emergency Department Provider Note   ____________________________________________    I have reviewed the triage vital signs and the nursing notes.   HISTORY  Chief Complaint Alcohol Problem (IVC)     HPI Charlene Woods is a 48 y.o. female with a history of alcohol abuse who presents today intoxicated.  Patient reports she drinks every single day.  She reports she was drinking heavily last night.  Questionable report that she may have taken more than her typical amount of blood pressure medication however she reports she took only 1 and she is quite adamant about this.  She denies SI HI or depression.  No abdominal pain.  No chest pain or shortness of breath.  Past Medical History:  Diagnosis Date  . Alcohol abuse   . Anxiety   . Asthma   . Depression   . Teeth missing     Patient Active Problem List   Diagnosis Date Noted  . Left lower quadrant pain 10/18/2015    Past Surgical History:  Procedure Laterality Date  . TUBAL LIGATION      Prior to Admission medications   Medication Sig Start Date End Date Taking? Authorizing Provider  albuterol (PROVENTIL HFA;VENTOLIN HFA) 108 (90 Base) MCG/ACT inhaler Inhale 2 puffs into the lungs every 6 (six) hours as needed for wheezing or shortness of breath. 02/17/16   Don Perking, [place], MD  amLODipine (NORVASC) 5 MG tablet Take 5 mg by mouth daily. 02/12/18   [provider]  diphenhydrAMINE (BENADRYL) 25 MG tablet Take 1 tablet (25 mg total) by mouth every 6 (six) hours. 06/13/15   Triplett, Tammy, PA-C  triamcinolone ointment (KENALOG) 0.5 % Apply 1 application topically 2 (two) times daily. 07/10/17   Phineas Semen, MD     Allergies Aspirin; Bee pollen; Codeine; Morphine and related; and Penicillins  Family History  Problem Relation Age of Onset  . Breast cancer Mother 25  . Diabetes Mother   . Ovarian cancer Neg Hx   . Colon cancer Neg Hx     Social History Social  History   Tobacco Use  . Smoking status: Current Every Day Smoker    Packs/day: 0.05    Types: Cigarettes  . Smokeless tobacco: Never Used  Substance Use Topics  . Alcohol use: Yes    Comment: 4-5 qd   . Drug use: No    Review of Systems  Constitutional: No fever/chills Eyes: No visual changes.  ENT: No sore throat. Cardiovascular: Denies chest pain. Respiratory: Denies shortness of breath. Gastrointestinal: As above Genitourinary: Negative for dysuria. Musculoskeletal: Negative for back pain. Skin: Negative for rash. Neurological: Negative for headaches    ____________________________________________   PHYSICAL EXAM:  VITAL SIGNS: ED Triage Vitals  Enc Vitals Group     BP 05/14/18 0939 98/65     Pulse Rate 05/14/18 0939 (!) 128     Resp 05/14/18 0939 20     Temp 05/14/18 0939 98.2 F (36.8 C)     Temp Source 05/14/18 0939 Oral     SpO2 05/14/18 0939 97 %     Weight 05/14/18 0939 65.8 kg (145 lb)     Height 05/14/18 0939 1.6 m (5\' 3" )     Head Circumference --      Peak Flow --      Pain Score 05/14/18 0938 0     Pain Loc --      Pain Edu? --      Excl. in GC? --  Constitutional: Alert and oriented.  Eyes: Conjunctivae are normal.  Head: Atraumatic Nose: No congestion/rhinnorhea. Mouth/Throat: Mucous membranes are moist.    Cardiovascular: Tachycardia, regular rhythm. Grossly normal heart sounds.  Good peripheral circulation. Respiratory: Normal respiratory effort.  No retractions. Lungs CTAB. Gastrointestinal: Soft and nontender. No distention.  No CVA tenderness.  Musculoskeletal:  Warm and well perfused Neurologic:  Normal speech and language. No gross focal neurologic deficits are appreciated.  Skin:  Skin is warm, dry and intact. No rash noted. Psychiatric: Mood and affect are normal. Speech and behavior are normal.  ____________________________________________   LABS (all labs ordered are listed, but only abnormal results are displayed)   Labs Reviewed  COMPREHENSIVE METABOLIC PANEL - Abnormal; Notable for the following components:      Result Value   CO2 12 (*)    Glucose, Bld 127 (*)    Creatinine, Ser 1.08 (*)    Alkaline Phosphatase 36 (*)    Anion gap 18 (*)    All other components within normal limits  ETHANOL - Abnormal; Notable for the following components:   Alcohol, Ethyl (B) 191 (*)    All other components within normal limits  CBC - Abnormal; Notable for the following components:   WBC 15.9 (*)    Hemoglobin 15.3 (*)    MCV 102.5 (*)    MCH 34.7 (*)    All other components within normal limits  URINE DRUG SCREEN, QUALITATIVE ([facility] ONLY) - Abnormal; Notable for the following components:   Cocaine Metabolite,Ur Brandt POSITIVE (*)    All other components within normal limits  POC URINE PREG, ED   ____________________________________________  EKG  ED ECG REPORT I, Jene Every, the attending physician, personally viewed and interpreted this ECG.  Date: 05/14/2018  Rhythm: Sinus tachycardia QRS Axis: normal Intervals: normal ST/T Wave abnormalities: Nonspecific changes Narrative Interpretation: no evidence of acute ischemia  ____________________________________________  RADIOLOGY  None ____________________________________________   PROCEDURES  Procedure(s) performed: No  Procedures   Critical Care performed: No ____________________________________________   INITIAL IMPRESSION / ASSESSMENT AND PLAN / ED COURSE  Pertinent labs & imaging results that were available during my care of the patient were reviewed by me and considered in my medical decision making (see chart for details).  Patient presents under IVC for evaluation.  She is tachycardic and clearly intoxicated.  She adamantly denies taking additional blood pressure medication.  Blood pressure here is stable however she is tachycardic.  We will give IV fluids, check EKG, labs and monitor carefully.  Bicarb is low, this is  likely related to alcohol.  Low-dose IV Ativan given  Pending consultation by Dr. Toni Amend      ____________________________________________   FINAL CLINICAL IMPRESSION(S) / ED DIAGNOSES  Final diagnoses:  Alcohol abuse        Note:  This document was prepared using Dragon voice recognition software and may include unintentional dictation errors.   Jene Every, MD 05/14/18 1450

## 2018-05-14 NOTE — ED Notes (Signed)
Patient's uncle states he would like to be notified if she goes somewhere --to another place.  Lindie Spruce [phone].

## 2018-05-14 NOTE — ED Notes (Signed)
First Nurse Note: Patient here with law enforcement for IVC.  Sitting in Triage Hallway with Officers present.  Patient is alert and cursing, color good.

## 2018-05-14 NOTE — Consult Note (Signed)
[facility] Face-to-Face Psychiatry Consult   Reason for Consult: Consult for this 48 year old woman brought here under IVC filed by her boyfriend Referring Physician: Juliette Alcide Patient Identification: Charlene Woods MRN:  [id number] Principal Diagnosis: Alcohol abuse Diagnosis:  Principal Problem:   Alcohol abuse Active Problems:   Substance induced mood disorder (HCC)   Total Time spent with patient: 1 hour  Subjective:   Charlene Woods is a 48 y.o. female patient admitted with "I am an alcoholic".  HPI: Patient seen chart reviewed.  Patient brought to the hospital under involuntary commitment paperwork taken out by her boyfriend.  Paperwork alleges that the patient had taken an overdose of blood pressure medicine.  Also reports that the patient was intoxicated.  Also alleges that the patient was throwing belongings out into the parking lot.  When patient came into the emergency room she was intoxicated.  She has been only partially cooperative since being here.  No seizures however and no violence to others.  Patient has consistently denied having taken an overdose stating that she only took the exact prescribed amount of her blood pressure medicine.  She admits that she drinks heavily every day.  Cannot quantify how much.  Admits that she sometimes uses other drugs.  Has refused to give a drug screen since coming into the emergency room.  Patient totally denies suicidal or homicidal ideation.  Requests discharge.  Has no interest in treatment.  Social history: She says that she and her boyfriend are being evicted from the apartment where they are living and have to move out into the country.  He says this is the reason she was moving belongings out into the parking lot.  Medical history: Patient denies history of seizures or DTs.  Has asthma and high blood pressure.  Substance abuse history: Patient says she has been drinking heavily since she was 48 years old.  Claims that she has never tried to  stop.  Never been in any kind of treatment at all.  No history of DTs or seizures.  Past Psychiatric History: Patient says she has seen psychiatrist in the past but it was many years ago.  Thinks she may have had a hospitalization but it was about 30 years ago.  No history of suicide attempts that she claims.  Not currently on any medication.  Risk to Self:   Risk to Others:   Prior Inpatient Therapy:   Prior Outpatient Therapy:    Past Medical History:  Past Medical History:  Diagnosis Date  . Alcohol abuse   . Anxiety   . Asthma   . Depression   . Teeth missing     Past Surgical History:  Procedure Laterality Date  . TUBAL LIGATION     Family History:  Family History  Problem Relation Age of Onset  . Breast cancer Mother 68  . Diabetes Mother   . Ovarian cancer Neg Hx   . Colon cancer Neg Hx    Family Psychiatric  History: Denies any Social History:  Social History   Substance and Sexual Activity  Alcohol Use Yes   Comment: 4-5 qd      Social History   Substance and Sexual Activity  Drug Use No    Social History   Socioeconomic History  . Marital status: Single    Spouse name: Not on file  . Number of children: Not on file  . Years of education: Not on file  . Highest education level: Not on file  Occupational  History  . Not on file  Social Needs  . Financial resource strain: Not on file  . Food insecurity:    Worry: Not on file    Inability: Not on file  . Transportation needs:    Medical: Not on file    Non-medical: Not on file  Tobacco Use  . Smoking status: Current Every Day Smoker    Packs/day: 0.05    Types: Cigarettes  . Smokeless tobacco: Never Used  Substance and Sexual Activity  . Alcohol use: Yes    Comment: 4-5 qd   . Drug use: No  . Sexual activity: Yes    Birth control/protection: Surgical  Lifestyle  . Physical activity:    Days per week: Not on file    Minutes per session: Not on file  . Stress: Not on file  Relationships   . Social connections:    Talks on phone: Not on file    Gets together: Not on file    Attends religious service: Not on file    Active member of club or organization: Not on file    Attends meetings of clubs or organizations: Not on file    Relationship status: Not on file  Other Topics Concern  . Not on file  Social History Narrative  . Not on file   Additional Social History:    Allergies:   Allergies  Allergen Reactions  . Aspirin   . Bee Pollen   . Codeine   . Morphine And Related   . Penicillins     Labs:  Results for orders placed or performed during the hospital encounter of 05/14/18 (from the past 48 hour(s))  Comprehensive metabolic panel     Status: Abnormal   Collection Time: 05/14/18  9:43 AM  Result Value Ref Range   Sodium 140 135 - 145 mmol/L   Potassium 3.5 3.5 - 5.1 mmol/L   Chloride 110 98 - 111 mmol/L   CO2 12 (L) 22 - 32 mmol/L   Glucose, Bld 127 (H) 70 - 99 mg/dL   BUN 6 6 - 20 mg/dL   Creatinine, Ser 0.93 (H) 0.44 - 1.00 mg/dL   Calcium 9.5 8.9 - 23.5 mg/dL   Total Protein 8.0 6.5 - 8.1 g/dL   Albumin 4.1 3.5 - 5.0 g/dL   AST 27 15 - 41 U/L   ALT 13 0 - 44 U/L   Alkaline Phosphatase 36 (L) 38 - 126 U/L   Total Bilirubin 0.4 0.3 - 1.2 mg/dL   GFR calc non Af Amer >60 >60 mL/min   GFR calc Af Amer >60 >60 mL/min   Anion gap 18 (H) 5 - 15    Comment: Performed at [facility], [address], [place], [place] [address]  Ethanol     Status: Abnormal   Collection Time: 05/14/18  9:43 AM  Result Value Ref Range   Alcohol, Ethyl (B) 191 (H) <10 mg/dL    Comment: (NOTE) Lowest detectable limit for serum alcohol is 10 mg/dL. For medical purposes only. Performed at [facility], [address] [place], [place] [address]   cbc     Status: Abnormal   Collection Time: 05/14/18  9:43 AM  Result Value Ref Range   WBC 15.9 (H) 4.0 - 10.5 K/uL   RBC 4.41 3.87 - 5.11 MIL/uL   Hemoglobin 15.3 (H) 12.0 - 15.0 g/dL   HCT 70.6  23.7 - 62.8 %   MCV 102.5 (H) 80.0 - 100.0 fL  MCH 34.7 (H) 26.0 - 34.0 pg   MCHC 33.8 30.0 - 36.0 g/dL   RDW 75.6 43.3 - 29.5 %   Platelets 400 150 - 400 K/uL   nRBC 0.0 0.0 - 0.2 %    Comment: Performed at [facility], [address] [place], [place] [address]  Urine Drug Screen, Qualitative     Status: Abnormal   Collection Time: 05/14/18  9:44 AM  Result Value Ref Range   Tricyclic, Ur Screen NONE DETECTED NONE DETECTED   Amphetamines, Ur Screen NONE DETECTED NONE DETECTED   MDMA (Ecstasy)Ur Screen NONE DETECTED NONE DETECTED   Cocaine Metabolite,Ur [place] POSITIVE (A) NONE DETECTED   Opiate, Ur Screen NONE DETECTED NONE DETECTED   Phencyclidine (PCP) Ur S NONE DETECTED NONE DETECTED   Cannabinoid 50 Ng, Ur [place] NONE DETECTED NONE DETECTED   Barbiturates, Ur Screen NONE DETECTED NONE DETECTED   Benzodiazepine, Ur Scrn NONE DETECTED NONE DETECTED   Methadone Scn, Ur NONE DETECTED NONE DETECTED    Comment: (NOTE) Tricyclics + metabolites, urine    Cutoff 1000 ng/mL Amphetamines + metabolites, urine  Cutoff 1000 ng/mL MDMA (Ecstasy), urine              Cutoff 500 ng/mL Cocaine Metabolite, urine          Cutoff 300 ng/mL Opiate + metabolites, urine        Cutoff 300 ng/mL Phencyclidine (PCP), urine         Cutoff 25 ng/mL Cannabinoid, urine                 Cutoff 50 ng/mL Barbiturates + metabolites, urine  Cutoff 200 ng/mL Benzodiazepine, urine              Cutoff 200 ng/mL Methadone, urine                   Cutoff 300 ng/mL The urine drug screen provides only a preliminary, unconfirmed analytical test result and should not be used for non-medical purposes. Clinical consideration and professional judgment should be applied to any positive drug screen result due to possible interfering substances. A more specific alternate chemical method must be used in order to obtain a confirmed analytical result. Gas chromatography / mass spectrometry (GC/MS) is the  preferred confirmat ory method. Performed at [facility], [address] [place], [place] [address]     No current facility-administered medications for this encounter.    Current Outpatient Medications  Medication Sig Dispense Refill  . albuterol (PROVENTIL HFA;VENTOLIN HFA) 108 (90 Base) MCG/ACT inhaler Inhale 2 puffs into the lungs every 6 (six) hours as needed for wheezing or shortness of breath. 1 Inhaler 2  . amLODipine (NORVASC) 5 MG tablet Take 5 mg by mouth daily.    . diphenhydrAMINE (BENADRYL) 25 MG tablet Take 1 tablet (25 mg total) by mouth every 6 (six) hours. 20 tablet 0  . triamcinolone ointment (KENALOG) 0.5 % Apply 1 application topically 2 (two) times daily. 30 g 0    Musculoskeletal: Strength & Muscle Tone: within normal limits Gait & Station: normal Patient leans: N/A  Psychiatric Specialty Exam: Physical Exam  Nursing note and vitals reviewed. Constitutional: She appears well-developed and well-nourished.  HENT:  Head: Normocephalic and atraumatic.  Eyes: Pupils are equal, round, and reactive to light. Conjunctivae are normal.  Neck: Normal range of motion.  Cardiovascular: Regular rhythm and normal heart sounds.  Respiratory: Effort normal. No respiratory distress.  GI: Soft.  Musculoskeletal: Normal  range of motion.  Neurological: She is alert.  Skin: Skin is warm and dry.  Psychiatric: Judgment normal. Her affect is blunt. Her speech is delayed. She is slowed. Thought content is not paranoid and not delusional. She expresses no homicidal and no suicidal ideation. She exhibits abnormal recent memory.    Review of Systems  Constitutional: Negative.   HENT: Negative.   Eyes: Negative.   Respiratory: Negative.   Cardiovascular: Negative.   Gastrointestinal: Negative.   Musculoskeletal: Negative.   Skin: Negative.   Neurological: Negative.   Psychiatric/Behavioral: Positive for substance abuse. Negative for depression, hallucinations,  memory loss and suicidal ideas. The patient is not nervous/anxious and does not have insomnia.     Blood pressure 101/68, pulse (!) 108, temperature 98.2 F (36.8 C), temperature source Oral, resp. rate 18, height  (1.6 m), weight 65.8 kg, SpO2 98 %.Body mass index is 25.69 kg/m.  General Appearance: Disheveled  Eye Contact:  Fair  Speech:  Clear and Coherent  Volume:  Decreased  Mood:  Dysphoric  Affect:  Constricted  Thought Process:  Coherent  Orientation:  Full (Time, Place, and Person)  Thought Content:  Logical  Suicidal Thoughts:  No  Homicidal Thoughts:  No  Memory:  Immediate;   Fair Recent;   Fair Remote;   Fair  Judgement:  Fair  Insight:  Shallow  Psychomotor Activity:  Decreased  Concentration:  Concentration: Fair  Recall:  [employer] of Knowledge:  Fair  Language:  Fair  Akathisia:  No  Handed:  Right  AIMS (if indicated):     Assets:  Desire for Improvement Housing  ADL's:  Intact  Cognition:  Impaired,  Mild  Sleep:        Treatment Plan Summary: Plan Patient is now sobering up.  She has consistently denied any suicidal ideation and denied that she tried to harm her self.  We do not have any evidence to the contrary other than the word of her boyfriend.  She has been hemodynamically stable here in the hospital.  Patient is able to discuss her alcohol abuse but denies wanting any treatment.  She understands the multiple severe medical risks of continued drinking.  She will be given information about outpatient resources at discharge.  Psychoeducation provided.  At this point however she does not meet commitment criteria and does not require any outpatient medicine.  Case reviewed with emergency room doctor in TTS.  Disposition: No evidence of imminent risk to self or others at present.   Patient does not meet criteria for psychiatric inpatient admission. Supportive therapy provided about ongoing stressors.  Mordecai Rasmussen, MD 05/14/2018 5:40 PM

## 2018-05-14 NOTE — ED Triage Notes (Addendum)
Pt arrives in police custody, arrives in handcuffs. Loud cursing heard from other triage room. Officers states fiance took IVC papers out on pt. Pt states she isn't going to talk to this RN. Pt states ETOH use form 8p to 5am today. Denies smoking. Denies drug use. Denies SI or HI. Appears intoxicated. Slurred speech noted. Pt uncooperative in triage.   IVC paper work states that pt took all her BP medication along with ETOH last night. Hx HTN, asthma per IVC paperwork.

## 2018-05-15 ENCOUNTER — Other Ambulatory Visit: Payer: Self-pay

## 2018-05-15 ENCOUNTER — Emergency Department
Admission: EM | Admit: 2018-05-15 | Discharge: 2018-05-16 | Disposition: A | Discharge status: Home / Self Care | Payer: Medicaid Other | Attending: Emergency Medicine | Admitting: Emergency Medicine

## 2018-05-15 ENCOUNTER — Encounter: Payer: Self-pay | Admitting: Emergency Medicine

## 2018-05-15 DIAGNOSIS — Z79899 Other long term (current) drug therapy: Secondary | ICD-10-CM | POA: Diagnosis not present

## 2018-05-15 DIAGNOSIS — Y906 Blood alcohol level of 120-199 mg/100 ml: Secondary | ICD-10-CM | POA: Insufficient documentation

## 2018-05-15 DIAGNOSIS — J45909 Unspecified asthma, uncomplicated: Secondary | ICD-10-CM | POA: Diagnosis not present

## 2018-05-15 DIAGNOSIS — F17210 Nicotine dependence, cigarettes, uncomplicated: Secondary | ICD-10-CM | POA: Diagnosis not present

## 2018-05-15 DIAGNOSIS — F1020 Alcohol dependence, uncomplicated: Secondary | ICD-10-CM | POA: Diagnosis not present

## 2018-05-15 LAB — COMPREHENSIVE METABOLIC PANEL
ALT: 14 U/L (ref 0–44)
AST: 20 U/L (ref 15–41)
Albumin: 3.4 g/dL — ABNORMAL LOW (ref 3.5–5.0)
Alkaline Phosphatase: 37 U/L — ABNORMAL LOW (ref 38–126)
Anion gap: 14 (ref 5–15)
BUN: 7 mg/dL (ref 6–20)
CO2: 16 mmol/L — ABNORMAL LOW (ref 22–32)
Calcium: 8.6 mg/dL — ABNORMAL LOW (ref 8.9–10.3)
Chloride: 106 mmol/L (ref 98–111)
Creatinine, Ser: 0.84 mg/dL (ref 0.44–1.00)
GFR calc Af Amer: >60 mL/min (ref >60)
GFR calc non Af Amer: >60 mL/min (ref >60)
Glucose, Bld: 106 mg/dL — ABNORMAL HIGH (ref 70–99)
Potassium: 3.1 mmol/L — ABNORMAL LOW (ref 3.5–5.1)
Sodium: 136 mmol/L (ref 135–145)
Total Bilirubin: 0.4 mg/dL (ref 0.3–1.2)
Total Protein: 6.9 g/dL (ref 6.5–8.1)

## 2018-05-15 LAB — CBC WITH DIFFERENTIAL/PLATELET
Abs Immature Granulocytes: 0.03 10*3/uL (ref 0.00–0.07)
Basophils Absolute: 0.1 10*3/uL (ref 0.0–0.1)
Basophils Relative: 1 %
Eosinophils Absolute: 0.1 10*3/uL (ref 0.0–0.5)
Eosinophils Relative: 2 %
HCT: 39 % (ref 36.0–46.0)
Hemoglobin: 13.5 g/dL (ref 12.0–15.0)
Immature Granulocytes: 0 %
Lymphocytes Relative: 32 %
Lymphs Abs: 2.3 10*3/uL (ref 0.7–4.0)
MCH: 35.2 pg — ABNORMAL HIGH (ref 26.0–34.0)
MCHC: 34.6 g/dL (ref 30.0–36.0)
MCV: 101.8 fL — ABNORMAL HIGH (ref 80.0–100.0)
Monocytes Absolute: 0.8 10*3/uL (ref 0.1–1.0)
Monocytes Relative: 11 %
Neutro Abs: 4 10*3/uL (ref 1.7–7.7)
Neutrophils Relative %: 54 %
Platelets: 303 10*3/uL (ref 150–400)
RBC: 3.83 MIL/uL — ABNORMAL LOW (ref 3.87–5.11)
RDW: 11.8 % (ref 11.5–15.5)
WBC: 7.3 10*3/uL (ref 4.0–10.5)
nRBC: 0 % (ref 0.0–0.2)

## 2018-05-15 LAB — SALICYLATE LEVEL: Salicylate Lvl: <7 mg/dL (ref 2.8–30.0)

## 2018-05-15 LAB — ACETAMINOPHEN LEVEL: Acetaminophen (Tylenol), Serum: <10 ug/mL — ABNORMAL LOW (ref 10–30)

## 2018-05-15 LAB — ETHANOL: Alcohol, Ethyl (B): 159 mg/dL — ABNORMAL HIGH (ref <10)

## 2018-05-15 MED ORDER — POTASSIUM CHLORIDE CRYS ER 20 MEQ PO TBCR
40.0000 meq | EXTENDED_RELEASE_TABLET | Freq: Once | ORAL | Status: DC
  Filled 2018-05-15: qty 2

## 2018-05-15 MED ORDER — MAGNESIUM SULFATE IN D5W 1-5 GM/100ML-% IV SOLN
1.0000 g | Freq: Once | INTRAVENOUS | Status: AC
  Administered 2018-05-16: 1 g via INTRAVENOUS
  Filled 2018-05-15: qty 100

## 2018-05-15 MED ORDER — SODIUM CHLORIDE 0.9 % IV BOLUS
1000.0000 mL | Freq: Once | INTRAVENOUS | Status: AC
  Administered 2018-05-15: 1000 mL via INTRAVENOUS

## 2018-05-15 MED ORDER — SODIUM CHLORIDE 0.9 % IV SOLN
1.0000 mg | Freq: Once | INTRAVENOUS | Status: AC
  Administered 2018-05-15: 1 mg via INTRAVENOUS
  Filled 2018-05-15: qty 0.2

## 2018-05-15 MED ORDER — THIAMINE HCL 100 MG/ML IJ SOLN
100.0000 mg | Freq: Once | INTRAMUSCULAR | Status: AC
  Administered 2018-05-15: 100 mg via INTRAVENOUS
  Filled 2018-05-15: qty 2

## 2018-05-15 NOTE — ED Triage Notes (Signed)
EMS pt to Rm 24 from home requesting detox from alcohol. States she drinks 6 cases a day  Has only had a couple  Of beers today.
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
## 2018-05-16 MED ORDER — POTASSIUM CHLORIDE 20 MEQ PO PACK
40.0000 meq | PACK | Freq: Once | ORAL | Status: AC
  Administered 2018-05-16: 40 meq via ORAL
  Filled 2018-05-16: qty 2

## 2018-05-16 MED ORDER — SODIUM CHLORIDE 0.9 % IV BOLUS
1000.0000 mL | Freq: Once | INTRAVENOUS | Status: AC
  Administered 2018-05-16: 1000 mL via INTRAVENOUS

## 2018-05-16 MED ORDER — LORAZEPAM 2 MG/ML IJ SOLN
1.0000 mg | Freq: Once | INTRAMUSCULAR | Status: AC
  Administered 2018-05-16: 1 mg via INTRAVENOUS
  Filled 2018-05-16: qty 1

## 2018-05-16 NOTE — BH Assessment (Signed)
Faxed referral paperwork to [facility] and Called [facility] [phone]) to confirm receipt and spoke to [facility] who stated patient was ok to be admitted to treatment as long as she had transportation. Per ED SEC Christen Bame) husband is here to transport. Writer updated ER MD (Dr. Mayford Knife), and patient's RN [facility]).

## 2018-05-16 NOTE — BH Assessment (Signed)
This Clinical research associate spoke with the admissions team at [facility] and they have available beds at this time however they do not have any open admission slots at this time. Pt's information was given to the intake coordinator as requested by the pt and they ask that the pt call back at 9:00 am to get an appointment time for admission.   Writer discussed with the patient about the option for detox/treatment with [facility].  Patient was in the agreement with the plan.  Patient signed the Release of information, in order to send labs and ER Note.  Information sent sent to [facility] via fax and confirmed it was received [phone]) Pending Review at this time

## 2018-05-16 NOTE — ED Notes (Addendum)
Pt advised not to consume alcohol before going to treatment center. Pt's husband, Martina Sinner, will be transporting pt to treatment center.

## 2018-05-16 NOTE — ED Provider Notes (Signed)
Clinical Course as of May 16 322  Sun May 16, 2018  8338 Patient has bed available [facility], will be available at [facility]. Plan to board her her so she can go direct to [facility].    [MQ]  [id number] The patient is resting comfortably at this time.   [MQ]    Clinical Course User Index [MQ] Sharyn Creamer, MD   Labs reviewed, potassium repleted.  Small dose of Ativan given for some tachycardia, she is a regular alcohol user.  Heart rate improved.  She is currently resting quite comfortably.  Her bicarbonate is low on arrival, but she is responding well to hydration.  Also given a small dose of Ativan for tachycardia with improvement.  She does not have a history of nausea vomiting, or abdominal pain, her presentation does not appear consistent with alcoholic ketoacidosis at this time.  Anion gap is normal.     Sharyn Creamer, MD 05/16/18 [phone]

## 2018-05-16 NOTE — ED Notes (Signed)
Pt sleeping at this time. Respirations are equal and unlabored. No distress noted at this time. ?

## 2018-05-16 NOTE — ED Notes (Signed)
Charlene Woods from TTS tried to called [facility], no answer

## 2019-04-02 ENCOUNTER — Encounter (HOSPITAL_COMMUNITY): Payer: Self-pay | Admitting: Obstetrics & Gynecology

## 2019-04-02 ENCOUNTER — Inpatient Hospital Stay (HOSPITAL_COMMUNITY)
Admission: AD | Admit: 2019-04-02 | Discharge: 2019-04-03 | Discharge status: Against Medical Advice | Payer: Medicaid Other | Attending: Obstetrics & Gynecology | Admitting: Obstetrics & Gynecology

## 2019-04-02 ENCOUNTER — Other Ambulatory Visit: Payer: Self-pay

## 2019-04-02 DIAGNOSIS — R109 Unspecified abdominal pain: Secondary | ICD-10-CM | POA: Diagnosis present

## 2019-04-02 DIAGNOSIS — Z5329 Procedure and treatment not carried out because of patient's decision for other reasons: Secondary | ICD-10-CM | POA: Insufficient documentation

## 2019-04-02 DIAGNOSIS — M545 Low back pain: Secondary | ICD-10-CM | POA: Diagnosis not present

## 2019-04-02 HISTORY — DX: Chronic obstructive pulmonary disease, unspecified: J44.9

## 2019-04-02 LAB — URINALYSIS, ROUTINE W REFLEX MICROSCOPIC
Bilirubin Urine: NEGATIVE
Glucose, UA: NEGATIVE mg/dL
Hgb urine dipstick: NEGATIVE
Ketones, ur: NEGATIVE mg/dL
Leukocytes,Ua: NEGATIVE
Nitrite: NEGATIVE
Protein, ur: NEGATIVE mg/dL
Specific Gravity, Urine: 1.002 — ABNORMAL LOW (ref 1.005–1.030)
pH: 6 (ref 5.0–8.0)

## 2019-04-02 LAB — POCT PREGNANCY, URINE: Preg Test, Ur: NEGATIVE

## 2019-04-02 NOTE — MAU Provider Note (Signed)
First Provider Initiated Contact with Patient 04/02/19 2257      S Ms. Charlene Woods is a 49 y.o. [phone] non-pregnant female who presents to MAU today with complaint of abdominal pain and lower back pain.  She states she is 4-5 months pregnant and had a positive pregnancy tests "a couple days ago." She states her LMP was "sometime in September."   O BP 122/83   Pulse 75   Temp 98.1 F (36.7 C)   LMP 11/24/2018 (Approximate)  Physical Exam  Constitutional: She is oriented to person, place, and time. She appears well-developed and well-nourished.  HENT:  Head: Normocephalic and atraumatic.  Eyes: Conjunctivae are normal.  Cardiovascular: Normal rate.  Respiratory: Effort normal.  GI: Soft. There is no abdominal tenderness.  Musculoskeletal:        General: Normal range of motion.     Cervical back: Normal range of motion.  Neurological: She is alert and oriented to person, place, and time.  Skin: Skin is warm and dry.  Psychiatric: She has a normal mood and affect. Her behavior is normal.    A Non pregnant female Medical screening exam complete  P Patient informed that UPT negative. Patient expresses disbelief. BSUS performed and confirms no enlargement of uterus or fetus noted. Patient informed that abdominal enlargement is not related to pregnancy. Offered to be transferred to [facility] for evaluation and agrees.  Nurse instructed to contact [facility] charge nurse.  Gerrit Heck, CNM 04/02/2019 10:59 PM

## 2019-04-02 NOTE — ED Triage Notes (Addendum)
Patient transferred from [facility] , patient is 5 months pregnant G4P3 reports left lateral abdominal pain/low back pain onset today , she is homeless with no prenatal check up, denies injury , no hematuria or dysuria , no emesis or diarrhea , denies fever or chills . Patient denies abdominal contractions or vaginal bleeding .

## 2019-04-02 NOTE — MAU Note (Addendum)
Patient reports to MAU via EMS c/o lower back pain and LUQ pain. Pt denies vaginal bleeding or discharge. Denies FM. 9/10 back pain and 5/10 for her abdomen. Pt c/o knot on her right knee. Pt reports tubal in 1992. Pt reports she has had 3 beers and 4 cigarette today.

## 2019-04-02 NOTE — MAU Note (Addendum)
[facility] [place] RN notified. Transport notified.

## 2019-04-03 LAB — COMPREHENSIVE METABOLIC PANEL
ALT: 45 U/L — ABNORMAL HIGH (ref 0–44)
AST: 65 U/L — ABNORMAL HIGH (ref 15–41)
Albumin: 3.6 g/dL (ref 3.5–5.0)
Alkaline Phosphatase: 57 U/L (ref 38–126)
Anion gap: 15 (ref 5–15)
BUN: 9 mg/dL (ref 6–20)
CO2: 19 mmol/L — ABNORMAL LOW (ref 22–32)
Calcium: 8.9 mg/dL (ref 8.9–10.3)
Chloride: 106 mmol/L (ref 98–111)
Creatinine, Ser: 0.65 mg/dL (ref 0.44–1.00)
GFR calc Af Amer: >60 mL/min (ref >60)
GFR calc non Af Amer: >60 mL/min (ref >60)
Glucose, Bld: 78 mg/dL (ref 70–99)
Potassium: 4.2 mmol/L (ref 3.5–5.1)
Sodium: 140 mmol/L (ref 135–145)
Total Bilirubin: 0.4 mg/dL (ref 0.3–1.2)
Total Protein: 6.3 g/dL — ABNORMAL LOW (ref 6.5–8.1)

## 2019-04-03 LAB — CBC
HCT: 45.7 % (ref 36.0–46.0)
Hemoglobin: 15.3 g/dL — ABNORMAL HIGH (ref 12.0–15.0)
MCH: 35.6 pg — ABNORMAL HIGH (ref 26.0–34.0)
MCHC: 33.5 g/dL (ref 30.0–36.0)
MCV: 106.3 fL — ABNORMAL HIGH (ref 80.0–100.0)
Platelets: 227 10*3/uL (ref 150–400)
RBC: 4.3 MIL/uL (ref 3.87–5.11)
RDW: 12.3 % (ref 11.5–15.5)
WBC: 5.9 10*3/uL (ref 4.0–10.5)
nRBC: 0 % (ref 0.0–0.2)

## 2019-04-03 LAB — LIPASE, BLOOD: Lipase: 32 U/L (ref 11–51)

## 2019-04-03 MED ORDER — SODIUM CHLORIDE 0.9% FLUSH: 3.0000 mL | Freq: Once | INTRAVENOUS | Status: DC

## 2019-04-03 NOTE — ED Notes (Signed)
OB rapid response nurse arrived to evaluate patient .

## 2019-04-03 NOTE — ED Notes (Signed)
Called OB  Rapid response to come see pt

## 2019-04-03 NOTE — ED Notes (Signed)
Patient left the ER.  

## 2019-07-01 ENCOUNTER — Encounter (HOSPITAL_COMMUNITY): Payer: Self-pay | Admitting: Obstetrics and Gynecology

## 2019-07-01 ENCOUNTER — Other Ambulatory Visit: Payer: Self-pay

## 2019-07-01 ENCOUNTER — Inpatient Hospital Stay (HOSPITAL_COMMUNITY)
Admission: AD | Admit: 2019-07-01 | Discharge: 2019-07-01 | Disposition: A | Discharge status: Home / Self Care | Payer: Medicaid Other | Attending: Obstetrics and Gynecology | Admitting: Obstetrics and Gynecology

## 2019-07-01 DIAGNOSIS — R140 Abdominal distension (gaseous): Secondary | ICD-10-CM | POA: Insufficient documentation

## 2019-07-01 DIAGNOSIS — N912 Amenorrhea, unspecified: Secondary | ICD-10-CM | POA: Insufficient documentation

## 2019-07-01 DIAGNOSIS — R1084 Generalized abdominal pain: Secondary | ICD-10-CM | POA: Diagnosis not present

## 2019-07-01 DIAGNOSIS — Z3202 Encounter for pregnancy test, result negative: Secondary | ICD-10-CM | POA: Diagnosis not present

## 2019-07-01 DIAGNOSIS — Z9851 Tubal ligation status: Secondary | ICD-10-CM | POA: Diagnosis not present

## 2019-07-01 LAB — POCT PREGNANCY, URINE: Preg Test, Ur: NEGATIVE

## 2019-07-01 NOTE — Discharge Instructions (Signed)

## 2019-07-01 NOTE — MAU Provider Note (Signed)
First Provider Initiated Contact with Patient 07/01/19 2045      S Charlene Woods is a 49 y.o. [phone] non-pregnant female who presents to MAU today with complaint of contractions.   Patient states she started having contractions that started about one hour ago.  Patient reports that she is 8 months pregnant and has a due date of June .  She endorses that she had a BTL in 1992, but questions why her abdomen is enlarged if she is not pregnant.   O BP 104/78   Pulse 70   Temp 97.7 F (36.5 C)   Resp 18  Physical Exam  Constitutional: She is oriented to person, place, and time. She appears well-developed and well-nourished.  HENT:  Head: Normocephalic and atraumatic.  Eyes: Conjunctivae are normal.  Cardiovascular: Normal rate, regular rhythm and normal heart sounds.  Respiratory: Effort normal and breath sounds normal. No respiratory distress.  GI: Soft. She exhibits distension.  Abdomen appears gravid.  NT  Musculoskeletal:     Cervical back: Normal range of motion.  Neurological: She is alert and oriented to person, place, and time.  Skin: Skin is warm and dry.  Psychiatric: She has a normal mood and affect. Her behavior is normal.    A Non pregnant female Medical screening exam complete   P -Patient informed of negative UPT -Discussed that it is unlikely that she will ever get pregnant again considering age and history of BTL.  -Patient further informed that her amenorrhea may be related to menopause or other unknown factor.  -Offered transfer to [facility] for further evaluation or seek care in outpatient facility of choice. -Patient opts for discharge. -Patient questions why her abdomen is enlarged and informed that she would require a formal evaluation before a diagnosis could be given. -Provider again offers transfer to [facility] and patient declines. -Warning signs for worsening condition that would warrant emergency follow-up discussed. -Patient discharged to home in  stable condition.    Gerrit Heck, CNM 07/01/2019 8:46 PM

## 2019-07-01 NOTE — MAU Note (Signed)
Pt arrived EMS. C/O ctx stated she was 8 month pregnant. C/o blood in urine when she wiped.

## 2019-07-19 ENCOUNTER — Telehealth: Payer: Self-pay | Admitting: Family Medicine

## 2019-07-19 NOTE — Telephone Encounter (Signed)
Received a call from [employer] [place] for this patient. She is homeless, and they said she was pregnant. She stated the patient had an appointment at [facility] that she had missed. She stated the patient needed to be seen sooner than the appointment I offered, which was 06/10. She said she would call [facility] to get her scheduled.

## 2019-07-29 ENCOUNTER — Telehealth: Payer: Self-pay

## 2019-07-29 NOTE — Telephone Encounter (Signed)
Message received from [facility],MSW/ [facility] Case Manager [facility]  Office Number [phone]  requesting appointment for patient to establish care.  The patient is not pregnant.  Secure message sent to her informing her that the appt has been scheduled at [facility] - 08/18/2019 @ 1530.

## 2019-08-17 ENCOUNTER — Telehealth: Payer: Self-pay

## 2019-08-17 NOTE — Patient Instructions (Signed)
Thank you for choosing Primary Care at [facility] to be your medical home!    Charlene Woods was seen by De Hollingshead, DO today.   [facility] primary care provider is Marcy Siren, DO.   For the best care possible, you should try to see Marcy Siren, DO whenever you come to the clinic.   We look forward to seeing you again soon!  If you have any questions about your visit today, please call us at [phone] or feel free to reach your primary care provider via MyChart.

## 2019-08-17 NOTE — Telephone Encounter (Signed)

## 2019-08-18 ENCOUNTER — Other Ambulatory Visit: Payer: Self-pay

## 2019-08-18 ENCOUNTER — Encounter: Payer: Self-pay | Admitting: Internal Medicine

## 2019-08-18 ENCOUNTER — Ambulatory Visit (INDEPENDENT_AMBULATORY_CARE_PROVIDER_SITE_OTHER): Payer: Medicaid Other | Admitting: Internal Medicine

## 2019-08-18 VITALS — BP 121/84 | HR 78 | Temp 97.5°F | Resp 17 | Ht 62.0 in | Wt 122.0 lb

## 2019-08-18 DIAGNOSIS — N926 Irregular menstruation, unspecified: Secondary | ICD-10-CM

## 2019-08-18 DIAGNOSIS — F17200 Nicotine dependence, unspecified, uncomplicated: Secondary | ICD-10-CM

## 2019-08-18 DIAGNOSIS — Z7689 Persons encountering health services in other specified circumstances: Secondary | ICD-10-CM

## 2019-08-18 DIAGNOSIS — F1010 Alcohol abuse, uncomplicated: Secondary | ICD-10-CM | POA: Diagnosis not present

## 2019-08-18 LAB — POCT URINE PREGNANCY: Preg Test, Ur: NEGATIVE

## 2019-08-18 MED ORDER — VARENICLINE TARTRATE 0.5 MG PO TABS: ORAL_TABLET | ORAL | 1 refills | Status: DC

## 2019-08-18 NOTE — Progress Notes (Signed)
Subjective:    Charlene Woods - 49 y.o. female MRN [id number]  Date of birth: 10/11/1970  HPI  Charlene Woods is to establish care. Patient has a PMH significant for alcohol and tobacco use. She is currently homeless.  She does want to ensure she is not pregnant. Reports no menstrual cycle for several months. Prior to this, menses were irregular.     ROS per HPI   Health Maintenance:  Health Maintenance Due  Topic Date Due  . Hepatitis C Screening  Never done  . COVID-19 Vaccine (1) Never done  . HIV Screening  Never done  . TETANUS/TDAP  Never done  . PAP SMEAR-Modifier  08/15/2018     Past Medical History: Patient Active Problem List   Diagnosis Date Noted  . Alcohol abuse 05/14/2018  . Substance induced mood disorder (HCC) 05/14/2018      Social History   reports that she has been smoking cigarettes. She has been smoking about 0.50 packs per day. She has never used smokeless tobacco. She reports current alcohol use of about 3.0 standard drinks of alcohol per week. She reports that she does not use drugs.   Family History  family history includes Breast cancer (age of onset: 47) in her mother; Diabetes in her mother.   Medications: reviewed and updated   Objective:   Physical Exam BP 121/84   Pulse 78   Temp (!) 97.5 F (36.4 C) (Temporal)   Resp 17   Ht 5\' 2"  (1.575 m)   Wt 122 lb (55.3 kg)   LMP  (LMP Unknown)   SpO2 93%   BMI 22.31 kg/m  Physical Exam Constitutional:      General: She is not in acute distress.    Appearance: She is not diaphoretic.  HENT:     Head: Normocephalic and atraumatic.  Eyes:     Conjunctiva/sclera: Conjunctivae normal.  Cardiovascular:     Rate and Rhythm: Normal rate and regular rhythm.     Heart sounds: Normal heart sounds. No murmur heard.   Pulmonary:     Effort: Pulmonary effort is normal. No respiratory distress.     Breath sounds: Normal breath sounds.  Musculoskeletal:        General: Normal range of  motion.  Skin:    General: Skin is warm and dry.  Neurological:     Mental Status: She is alert and oriented to person, place, and time.  Psychiatric:        Mood and Affect: Affect normal.        Judgment: Judgment normal.         Assessment & Plan:   1. Encounter to establish care Reviewed patient's PMH, social history, surgical history, and medications.  Is overdue for annual exam, screening blood work, and health maintenance topics. Have asked patient to return for visit to address these items.   2. Tobacco use disorder Patient currently smokes 0.5-1 ppd. Spent 4 minutes counseling on dangers of tobacco use and benefits of quitting, offered pharmacological intervention to aid quitting and patient  ready to quit. Therapy started: Chantix.  - varenicline (CHANTIX) 0.5 MG tablet; Take one tablet daily days 1-3. Then take two tablets daily days 4-7. After one week, increase to two tablets twice per day and continue for at least 11 weeks.  Dispense: 180 tablet; Refill: 1  3. Alcohol abuse Offered support. Patient not ready to quit. Noted that patient had minimally elevated LFTs in Jan 2021. Will plan to  repeat at annual exam.   4. Missed menses Upreg neg. Patient has had a BTL so counseled that pregnancy would be unlikely. Discussed with patient that suspect she is in perimenopausal. Discussed this with patient and that typical pattern of menses will change in this time. Reviewed that she has been seen several times for concern of pregnancy.  - POCT urine pregnancy      Phill Myron, D.O. 08/31/2019, 7:13 PM Primary Care at [facility]

## 2019-08-23 ENCOUNTER — Telehealth: Payer: Self-pay

## 2019-08-23 NOTE — Telephone Encounter (Signed)
Call placed to Charlene Woods/[facility] regarding the certification of disability form.  Message left with call back requested to this CM  # [phone]

## 2019-08-24 NOTE — Telephone Encounter (Signed)
Second call placed to [employer] [facility] regarding the certification of disability form.  Message left with call back requested to this CM  # [phone]

## 2019-08-31 ENCOUNTER — Telehealth: Payer: Self-pay

## 2019-08-31 ENCOUNTER — Telehealth: Payer: Self-pay | Admitting: Family Medicine

## 2019-08-31 NOTE — Telephone Encounter (Signed)
Patient did not have any labs drawn at last visit.

## 2019-08-31 NOTE — Telephone Encounter (Signed)
This CM spoke to [employer] [facility] regarding the certification of disability and need for more information about the patient's disability to complete the form. She explained that the patient is currently receiving disability and this document is needed for the [facility] housing program. the diagnosis for her disability  application was " mental retardation."  [name] said that she will try to obtain documentation that supports this diagnosis.

## 2019-08-31 NOTE — Telephone Encounter (Signed)
Called lvm

## 2019-08-31 NOTE — Telephone Encounter (Signed)
Patient called requesting her labs that she had done on her last visit Patient was informed that the nurse and PCP were out of the office. Patient case manager Melida Quitter said that the patient does not have a phone and to call her at [phone] so she can go to the patient house and return the call for the results.

## 2019-09-20 ENCOUNTER — Telehealth: Payer: Self-pay

## 2019-09-20 NOTE — Telephone Encounter (Signed)
Called patient to do their pre-visit COVID screening.  Call went to voicemail. Unable to do prescreening.  

## 2019-09-21 ENCOUNTER — Ambulatory Visit: Payer: Medicaid Other | Admitting: Internal Medicine

## 2019-11-05 ENCOUNTER — Emergency Department (HOSPITAL_COMMUNITY): Payer: Medicaid Other

## 2019-11-05 ENCOUNTER — Other Ambulatory Visit: Payer: Self-pay

## 2019-11-05 ENCOUNTER — Inpatient Hospital Stay (HOSPITAL_COMMUNITY): Payer: Medicaid Other

## 2019-11-05 ENCOUNTER — Observation Stay (HOSPITAL_COMMUNITY): Payer: Medicaid Other | Admitting: Anesthesiology

## 2019-11-05 ENCOUNTER — Inpatient Hospital Stay (HOSPITAL_COMMUNITY)
Admission: EM | Admit: 2019-11-05 | Discharge: 2019-11-11 | DRG: 492 | Disposition: A | Discharge status: Home / Self Care | Payer: Medicaid Other | Attending: Physician Assistant | Admitting: Physician Assistant

## 2019-11-05 ENCOUNTER — Encounter (HOSPITAL_COMMUNITY): Payer: Self-pay | Admitting: Radiology

## 2019-11-05 ENCOUNTER — Encounter (HOSPITAL_COMMUNITY): Admission: EM | Disposition: A | Discharge status: Home / Self Care | Payer: Self-pay | Source: Home / Self Care

## 2019-11-05 DIAGNOSIS — Y92410 Unspecified street and highway as the place of occurrence of the external cause: Secondary | ICD-10-CM | POA: Diagnosis not present

## 2019-11-05 DIAGNOSIS — F17210 Nicotine dependence, cigarettes, uncomplicated: Secondary | ICD-10-CM | POA: Diagnosis present

## 2019-11-05 DIAGNOSIS — S32392A Other fracture of left ilium, initial encounter for closed fracture: Secondary | ICD-10-CM | POA: Diagnosis present

## 2019-11-05 DIAGNOSIS — S42202A Unspecified fracture of upper end of left humerus, initial encounter for closed fracture: Secondary | ICD-10-CM

## 2019-11-05 DIAGNOSIS — S32009A Unspecified fracture of unspecified lumbar vertebra, initial encounter for closed fracture: Secondary | ICD-10-CM

## 2019-11-05 DIAGNOSIS — S0083XA Contusion of other part of head, initial encounter: Secondary | ICD-10-CM | POA: Diagnosis present

## 2019-11-05 DIAGNOSIS — D62 Acute posthemorrhagic anemia: Secondary | ICD-10-CM | POA: Diagnosis present

## 2019-11-05 DIAGNOSIS — T148XXA Other injury of unspecified body region, initial encounter: Secondary | ICD-10-CM

## 2019-11-05 DIAGNOSIS — S32059B Unspecified fracture of fifth lumbar vertebra, initial encounter for open fracture: Secondary | ICD-10-CM | POA: Diagnosis present

## 2019-11-05 DIAGNOSIS — S32302B Unspecified fracture of left ilium, initial encounter for open fracture: Secondary | ICD-10-CM

## 2019-11-05 DIAGNOSIS — S42309A Unspecified fracture of shaft of humerus, unspecified arm, initial encounter for closed fracture: Secondary | ICD-10-CM | POA: Diagnosis present

## 2019-11-05 DIAGNOSIS — T797XXA Traumatic subcutaneous emphysema, initial encounter: Secondary | ICD-10-CM | POA: Diagnosis present

## 2019-11-05 DIAGNOSIS — F10129 Alcohol abuse with intoxication, unspecified: Secondary | ICD-10-CM | POA: Diagnosis present

## 2019-11-05 DIAGNOSIS — I10 Essential (primary) hypertension: Secondary | ICD-10-CM | POA: Diagnosis present

## 2019-11-05 DIAGNOSIS — S42292A Other displaced fracture of upper end of left humerus, initial encounter for closed fracture: Secondary | ICD-10-CM

## 2019-11-05 DIAGNOSIS — S0031XA Abrasion of nose, initial encounter: Secondary | ICD-10-CM | POA: Diagnosis present

## 2019-11-05 DIAGNOSIS — J449 Chronic obstructive pulmonary disease, unspecified: Secondary | ICD-10-CM | POA: Diagnosis present

## 2019-11-05 DIAGNOSIS — F1490 Cocaine use, unspecified, uncomplicated: Secondary | ICD-10-CM | POA: Diagnosis present

## 2019-11-05 DIAGNOSIS — F17213 Nicotine dependence, cigarettes, with withdrawal: Secondary | ICD-10-CM | POA: Diagnosis present

## 2019-11-05 DIAGNOSIS — F1010 Alcohol abuse, uncomplicated: Secondary | ICD-10-CM

## 2019-11-05 DIAGNOSIS — S0003XA Contusion of scalp, initial encounter: Secondary | ICD-10-CM | POA: Diagnosis present

## 2019-11-05 DIAGNOSIS — F17200 Nicotine dependence, unspecified, uncomplicated: Secondary | ICD-10-CM

## 2019-11-05 DIAGNOSIS — Z590 Homelessness: Secondary | ICD-10-CM | POA: Diagnosis not present

## 2019-11-05 DIAGNOSIS — Z20822 Contact with and (suspected) exposure to covid-19: Secondary | ICD-10-CM | POA: Diagnosis present

## 2019-11-05 DIAGNOSIS — S32009B Unspecified fracture of unspecified lumbar vertebra, initial encounter for open fracture: Secondary | ICD-10-CM

## 2019-11-05 DIAGNOSIS — Y906 Blood alcohol level of 120-199 mg/100 ml: Secondary | ICD-10-CM | POA: Diagnosis present

## 2019-11-05 DIAGNOSIS — T1490XA Injury, unspecified, initial encounter: Secondary | ICD-10-CM

## 2019-11-05 DIAGNOSIS — S32058B Other fracture of fifth lumbar vertebra, initial encounter for open fracture: Secondary | ICD-10-CM | POA: Diagnosis present

## 2019-11-05 DIAGNOSIS — S32048B Other fracture of fourth lumbar vertebra, initial encounter for open fracture: Secondary | ICD-10-CM | POA: Diagnosis present

## 2019-11-05 DIAGNOSIS — S31020A Laceration with foreign body of lower back and pelvis without penetration into retroperitoneum, initial encounter: Secondary | ICD-10-CM | POA: Diagnosis present

## 2019-11-05 DIAGNOSIS — S32302A Unspecified fracture of left ilium, initial encounter for closed fracture: Secondary | ICD-10-CM

## 2019-11-05 DIAGNOSIS — S42212A Unspecified displaced fracture of surgical neck of left humerus, initial encounter for closed fracture: Principal | ICD-10-CM | POA: Diagnosis present

## 2019-11-05 HISTORY — PX: LUMBAR WOUND DEBRIDEMENT: SHX1988

## 2019-11-05 LAB — LACTIC ACID, PLASMA: Lactic Acid, Venous: 3.4 mmol/L (ref 0.5–1.9)

## 2019-11-05 LAB — I-STAT CHEM 8, ED
BUN: 5 mg/dL — ABNORMAL LOW (ref 6–20)
Calcium, Ion: 0.89 mmol/L — CL (ref 1.15–1.40)
Chloride: 109 mmol/L (ref 98–111)
Creatinine, Ser: 0.9 mg/dL (ref 0.44–1.00)
Glucose, Bld: 131 mg/dL — ABNORMAL HIGH (ref 70–99)
HCT: 47 % — ABNORMAL HIGH (ref 36.0–46.0)
Hemoglobin: 16 g/dL — ABNORMAL HIGH (ref 12.0–15.0)
Potassium: 7.8 mmol/L (ref 3.5–5.1)
Sodium: 137 mmol/L (ref 135–145)
TCO2: 21 mmol/L — ABNORMAL LOW (ref 22–32)

## 2019-11-05 LAB — POCT I-STAT, CHEM 8
BUN: 6 mg/dL (ref 6–20)
Calcium, Ion: 1.07 mmol/L — ABNORMAL LOW (ref 1.15–1.40)
Chloride: 105 mmol/L (ref 98–111)
Creatinine, Ser: 0.5 mg/dL (ref 0.44–1.00)
Glucose, Bld: 119 mg/dL — ABNORMAL HIGH (ref 70–99)
HCT: 45 % (ref 36.0–46.0)
Hemoglobin: 15.3 g/dL — ABNORMAL HIGH (ref 12.0–15.0)
Potassium: 3.9 mmol/L (ref 3.5–5.1)
Sodium: 138 mmol/L (ref 135–145)
TCO2: 18 mmol/L — ABNORMAL LOW (ref 22–32)

## 2019-11-05 LAB — CBC
HCT: 47.9 % — ABNORMAL HIGH (ref 36.0–46.0)
Hemoglobin: 15.5 g/dL — ABNORMAL HIGH (ref 12.0–15.0)
MCH: 34.6 pg — ABNORMAL HIGH (ref 26.0–34.0)
MCHC: 32.4 g/dL (ref 30.0–36.0)
MCV: 106.9 fL — ABNORMAL HIGH (ref 80.0–100.0)
Platelets: 337 10*3/uL (ref 150–400)
RBC: 4.48 MIL/uL (ref 3.87–5.11)
RDW: 11.8 % (ref 11.5–15.5)
WBC: 7.9 10*3/uL (ref 4.0–10.5)
nRBC: 0 % (ref 0.0–0.2)

## 2019-11-05 LAB — COMPREHENSIVE METABOLIC PANEL
ALT: 34 U/L (ref 0–44)
AST: 93 U/L — ABNORMAL HIGH (ref 15–41)
Albumin: 3.6 g/dL (ref 3.5–5.0)
Alkaline Phosphatase: 50 U/L (ref 38–126)
Anion gap: 14 (ref 5–15)
BUN: 6 mg/dL (ref 6–20)
CO2: 18 mmol/L — ABNORMAL LOW (ref 22–32)
Calcium: 8.4 mg/dL — ABNORMAL LOW (ref 8.9–10.3)
Chloride: 104 mmol/L (ref 98–111)
Creatinine, Ser: 0.81 mg/dL (ref 0.44–1.00)
GFR calc Af Amer: >60 mL/min (ref >60)
GFR calc non Af Amer: >60 mL/min (ref >60)
Glucose, Bld: 138 mg/dL — ABNORMAL HIGH (ref 70–99)
Potassium: 5.8 mmol/L — ABNORMAL HIGH (ref 3.5–5.1)
Sodium: 136 mmol/L (ref 135–145)
Total Bilirubin: 1.3 mg/dL — ABNORMAL HIGH (ref 0.3–1.2)
Total Protein: 6.6 g/dL (ref 6.5–8.1)

## 2019-11-05 LAB — PROTIME-INR
INR: 0.9 (ref 0.8–1.2)
Prothrombin Time: 12.1 seconds (ref 11.4–15.2)

## 2019-11-05 LAB — SARS CORONAVIRUS 2 BY RT PCR (HOSPITAL ORDER, PERFORMED IN ~~LOC~~ HOSPITAL LAB): SARS Coronavirus 2: NEGATIVE

## 2019-11-05 LAB — ETHANOL: Alcohol, Ethyl (B): 194 mg/dL — ABNORMAL HIGH (ref <10)

## 2019-11-05 LAB — SAMPLE TO BLOOD BANK

## 2019-11-05 SURGERY — LUMBAR WOUND DEBRIDEMENT
Anesthesia: General | Site: Back

## 2019-11-05 MED ORDER — PROPOFOL 10 MG/ML IV BOLUS
INTRAVENOUS | Status: AC
  Filled 2019-11-05: qty 20

## 2019-11-05 MED ORDER — CEFAZOLIN SODIUM-DEXTROSE 2-4 GM/100ML-% IV SOLN
2.0000 g | Freq: Three times a day (TID) | INTRAVENOUS | Status: AC
  Administered 2019-11-05 – 2019-11-06 (×2): 2 g via INTRAVENOUS
  Filled 2019-11-05 (×2): qty 100

## 2019-11-05 MED ORDER — ONDANSETRON 4 MG PO TBDP: 4.0000 mg | ORAL_TABLET | Freq: Four times a day (QID) | ORAL | Status: DC | PRN

## 2019-11-05 MED ORDER — PHENYLEPHRINE HCL-NACL 10-0.9 MG/250ML-% IV SOLN
INTRAVENOUS | Status: DC | PRN
  Administered 2019-11-05: 50 ug/min via INTRAVENOUS

## 2019-11-05 MED ORDER — ONDANSETRON HCL 4 MG/2ML IJ SOLN
INTRAMUSCULAR | Status: AC
  Filled 2019-11-05: qty 2

## 2019-11-05 MED ORDER — SODIUM CHLORIDE 0.9% FLUSH: 3.0000 mL | INTRAVENOUS | Status: DC | PRN

## 2019-11-05 MED ORDER — IOHEXOL 300 MG/ML  SOLN
100.0000 mL | Freq: Once | INTRAMUSCULAR | Status: AC | PRN
  Administered 2019-11-05: 100 mL via INTRAVENOUS

## 2019-11-05 MED ORDER — MIDAZOLAM HCL 5 MG/5ML IJ SOLN
INTRAMUSCULAR | Status: DC | PRN
  Administered 2019-11-05: 2 mg via INTRAVENOUS

## 2019-11-05 MED ORDER — PHENYLEPHRINE 40 MCG/ML (10ML) SYRINGE FOR IV PUSH (FOR BLOOD PRESSURE SUPPORT)
PREFILLED_SYRINGE | INTRAVENOUS | Status: AC
  Filled 2019-11-05: qty 10

## 2019-11-05 MED ORDER — LACTATED RINGERS IV SOLN: INTRAVENOUS | Status: DC | PRN

## 2019-11-05 MED ORDER — ACETAMINOPHEN 10 MG/ML IV SOLN: 1000.0000 mg | Freq: Once | INTRAVENOUS | Status: DC | PRN

## 2019-11-05 MED ORDER — PROPOFOL 10 MG/ML IV BOLUS
INTRAVENOUS | Status: DC | PRN
  Administered 2019-11-05: 100 mg via INTRAVENOUS

## 2019-11-05 MED ORDER — BACITRACIN ZINC 500 UNIT/GM EX OINT
TOPICAL_OINTMENT | CUTANEOUS | Status: DC | PRN
  Administered 2019-11-05: 1 via TOPICAL

## 2019-11-05 MED ORDER — SUGAMMADEX SODIUM 200 MG/2ML IV SOLN
INTRAVENOUS | Status: DC | PRN
  Administered 2019-11-05: 200 mg via INTRAVENOUS

## 2019-11-05 MED ORDER — ONDANSETRON HCL 4 MG/2ML IJ SOLN: 4.0000 mg | Freq: Four times a day (QID) | INTRAMUSCULAR | Status: DC | PRN

## 2019-11-05 MED ORDER — SODIUM CHLORIDE 0.9 % IV SOLN: INTRAVENOUS | Status: DC

## 2019-11-05 MED ORDER — BUPIVACAINE-EPINEPHRINE 0.5% -1:200000 IJ SOLN
INTRAMUSCULAR | Status: AC
  Filled 2019-11-05: qty 1

## 2019-11-05 MED ORDER — ACETAMINOPHEN 160 MG/5ML PO SOLN: 325.0000 mg | Freq: Once | ORAL | Status: DC | PRN

## 2019-11-05 MED ORDER — HYDROMORPHONE HCL 1 MG/ML IJ SOLN: 0.5000 mg | INTRAMUSCULAR | Status: DC | PRN

## 2019-11-05 MED ORDER — DEXAMETHASONE SODIUM PHOSPHATE 10 MG/ML IJ SOLN
INTRAMUSCULAR | Status: AC
  Filled 2019-11-05: qty 1

## 2019-11-05 MED ORDER — HYDROMORPHONE HCL 2 MG PO TABS: 2.0000 mg | ORAL_TABLET | ORAL | Status: DC | PRN

## 2019-11-05 MED ORDER — ACETAMINOPHEN 500 MG PO TABS
1000.0000 mg | ORAL_TABLET | Freq: Four times a day (QID) | ORAL | Status: AC
  Administered 2019-11-05 – 2019-11-06 (×3): 1000 mg via ORAL
  Filled 2019-11-05 (×3): qty 2

## 2019-11-05 MED ORDER — LIDOCAINE 2% (20 MG/ML) 5 ML SYRINGE
INTRAMUSCULAR | Status: AC
  Filled 2019-11-05: qty 5

## 2019-11-05 MED ORDER — FENTANYL CITRATE (PF) 250 MCG/5ML IJ SOLN
INTRAMUSCULAR | Status: AC
Start: 2019-11-05 — End: ?
  Filled 2019-11-05: qty 5

## 2019-11-05 MED ORDER — SUCCINYLCHOLINE CHLORIDE 20 MG/ML IJ SOLN
INTRAMUSCULAR | Status: DC | PRN
  Administered 2019-11-05: 120 mg via INTRAVENOUS

## 2019-11-05 MED ORDER — PHENYLEPHRINE 40 MCG/ML (10ML) SYRINGE FOR IV PUSH (FOR BLOOD PRESSURE SUPPORT)
PREFILLED_SYRINGE | INTRAVENOUS | Status: DC | PRN
  Administered 2019-11-05: 120 ug via INTRAVENOUS
  Administered 2019-11-05: 80 ug via INTRAVENOUS

## 2019-11-05 MED ORDER — FENTANYL CITRATE (PF) 100 MCG/2ML IJ SOLN
INTRAMUSCULAR | Status: AC
  Administered 2019-11-05: 50 ug via INTRAVENOUS
  Filled 2019-11-05: qty 2

## 2019-11-05 MED ORDER — BISACODYL 10 MG RE SUPP: 10.0000 mg | Freq: Every day | RECTAL | Status: DC | PRN

## 2019-11-05 MED ORDER — MENTHOL 3 MG MT LOZG: 1.0000 | LOZENGE | OROMUCOSAL | Status: DC | PRN

## 2019-11-05 MED ORDER — ACETAMINOPHEN 650 MG RE SUPP: 650.0000 mg | RECTAL | Status: DC | PRN

## 2019-11-05 MED ORDER — DOCUSATE SODIUM 100 MG PO CAPS
100.0000 mg | ORAL_CAPSULE | Freq: Two times a day (BID) | ORAL | Status: DC
  Filled 2019-11-05: qty 1

## 2019-11-05 MED ORDER — LIDOCAINE 2% (20 MG/ML) 5 ML SYRINGE
INTRAMUSCULAR | Status: DC | PRN
  Administered 2019-11-05: 40 mg via INTRAVENOUS

## 2019-11-05 MED ORDER — ROCURONIUM BROMIDE 10 MG/ML (PF) SYRINGE
PREFILLED_SYRINGE | INTRAVENOUS | Status: AC
  Filled 2019-11-05: qty 10

## 2019-11-05 MED ORDER — BUPIVACAINE-EPINEPHRINE 0.5% -1:200000 IJ SOLN
INTRAMUSCULAR | Status: DC | PRN
  Administered 2019-11-05: 10 mL

## 2019-11-05 MED ORDER — SODIUM CHLORIDE 0.9% FLUSH
3.0000 mL | Freq: Two times a day (BID) | INTRAVENOUS | Status: DC
  Administered 2019-11-05: 3 mL via INTRAVENOUS

## 2019-11-05 MED ORDER — CEFAZOLIN SODIUM-DEXTROSE 2-4 GM/100ML-% IV SOLN
2.0000 g | Freq: Three times a day (TID) | INTRAVENOUS | Status: DC
  Administered 2019-11-05: 2 g via INTRAVENOUS

## 2019-11-05 MED ORDER — ZOLPIDEM TARTRATE 5 MG PO TABS: 5.0000 mg | ORAL_TABLET | Freq: Every evening | ORAL | Status: DC | PRN

## 2019-11-05 MED ORDER — SODIUM CHLORIDE 0.9 % IV SOLN: 250.0000 mL | INTRAVENOUS | Status: DC

## 2019-11-05 MED ORDER — DOCUSATE SODIUM 100 MG PO CAPS
100.0000 mg | ORAL_CAPSULE | Freq: Two times a day (BID) | ORAL | Status: DC
  Administered 2019-11-05 – 2019-11-11 (×11): 100 mg via ORAL
  Filled 2019-11-05 (×13): qty 1

## 2019-11-05 MED ORDER — FENTANYL CITRATE (PF) 100 MCG/2ML IJ SOLN: 50.0000 ug | Freq: Once | INTRAMUSCULAR | Status: AC

## 2019-11-05 MED ORDER — SODIUM CHLORIDE 0.9 % IV SOLN
INTRAVENOUS | Status: DC | PRN
  Administered 2019-11-05: 500 mL

## 2019-11-05 MED ORDER — ONDANSETRON HCL 4 MG/2ML IJ SOLN
INTRAMUSCULAR | Status: DC | PRN
  Administered 2019-11-05: 4 mg via INTRAVENOUS

## 2019-11-05 MED ORDER — CYCLOBENZAPRINE HCL 10 MG PO TABS
10.0000 mg | ORAL_TABLET | Freq: Three times a day (TID) | ORAL | Status: DC | PRN
  Administered 2019-11-05: 10 mg via ORAL
  Filled 2019-11-05: qty 1

## 2019-11-05 MED ORDER — FENTANYL CITRATE (PF) 250 MCG/5ML IJ SOLN
INTRAMUSCULAR | Status: AC
  Filled 2019-11-05: qty 5

## 2019-11-05 MED ORDER — ROCURONIUM BROMIDE 10 MG/ML (PF) SYRINGE
PREFILLED_SYRINGE | INTRAVENOUS | Status: DC | PRN
  Administered 2019-11-05: 50 mg via INTRAVENOUS

## 2019-11-05 MED ORDER — MEPERIDINE HCL 25 MG/ML IJ SOLN: 6.2500 mg | INTRAMUSCULAR | Status: DC | PRN

## 2019-11-05 MED ORDER — ACETAMINOPHEN 325 MG PO TABS
650.0000 mg | ORAL_TABLET | ORAL | Status: DC | PRN
  Administered 2019-11-07 – 2019-11-08 (×3): 650 mg via ORAL
  Filled 2019-11-05 (×3): qty 2

## 2019-11-05 MED ORDER — THROMBIN 5000 UNITS EX SOLR
CUTANEOUS | Status: AC
  Filled 2019-11-05: qty 5000

## 2019-11-05 MED ORDER — THROMBIN 5000 UNITS EX SOLR
OROMUCOSAL | Status: DC | PRN
  Administered 2019-11-05: 5 mL via TOPICAL

## 2019-11-05 MED ORDER — OXYCODONE HCL 5 MG PO TABS
5.0000 mg | ORAL_TABLET | ORAL | Status: DC | PRN
  Administered 2019-11-05 – 2019-11-09 (×3): 5 mg via ORAL
  Filled 2019-11-05 (×3): qty 1

## 2019-11-05 MED ORDER — BACITRACIN ZINC 500 UNIT/GM EX OINT
TOPICAL_OINTMENT | CUTANEOUS | Status: AC
  Filled 2019-11-05: qty 28.35

## 2019-11-05 MED ORDER — ACETAMINOPHEN 325 MG PO TABS: 325.0000 mg | ORAL_TABLET | Freq: Once | ORAL | Status: DC | PRN

## 2019-11-05 MED ORDER — OXYCODONE HCL 5 MG PO TABS
10.0000 mg | ORAL_TABLET | ORAL | Status: DC | PRN
  Administered 2019-11-06 – 2019-11-11 (×16): 10 mg via ORAL
  Filled 2019-11-05 (×16): qty 2

## 2019-11-05 MED ORDER — HYDROMORPHONE HCL 1 MG/ML IJ SOLN: 0.2500 mg | INTRAMUSCULAR | Status: DC | PRN

## 2019-11-05 MED ORDER — DEXAMETHASONE SODIUM PHOSPHATE 10 MG/ML IJ SOLN
INTRAMUSCULAR | Status: DC | PRN
  Administered 2019-11-05: 10 mg via INTRAVENOUS

## 2019-11-05 MED ORDER — MIDAZOLAM HCL 2 MG/2ML IJ SOLN
INTRAMUSCULAR | Status: AC
  Filled 2019-11-05: qty 2

## 2019-11-05 MED ORDER — CEFAZOLIN SODIUM-DEXTROSE 2-4 GM/100ML-% IV SOLN
2.0000 g | Freq: Once | INTRAVENOUS | Status: AC
  Administered 2019-11-05: 2 g via INTRAVENOUS
  Filled 2019-11-05: qty 100

## 2019-11-05 MED ORDER — FENTANYL CITRATE (PF) 250 MCG/5ML IJ SOLN
INTRAMUSCULAR | Status: DC | PRN
Start: 2019-11-05 — End: 2019-11-05
  Administered 2019-11-05 (×2): 50 ug via INTRAVENOUS
  Administered 2019-11-05: 100 ug via INTRAVENOUS

## 2019-11-05 MED ORDER — ONDANSETRON HCL 4 MG PO TABS: 4.0000 mg | ORAL_TABLET | Freq: Four times a day (QID) | ORAL | Status: DC | PRN

## 2019-11-05 MED ORDER — PHENOL 1.4 % MT LIQD: 1.0000 | OROMUCOSAL | Status: DC | PRN

## 2019-11-05 MED ORDER — 0.9 % SODIUM CHLORIDE (POUR BTL) OPTIME
TOPICAL | Status: DC | PRN
  Administered 2019-11-05: 1000 mL

## 2019-11-05 MED ORDER — AMISULPRIDE (ANTIEMETIC) 5 MG/2ML IV SOLN: 10.0000 mg | Freq: Once | INTRAVENOUS | Status: DC | PRN

## 2019-11-05 MED ORDER — CEFAZOLIN SODIUM 1 G IJ SOLR
INTRAMUSCULAR | Status: AC
  Filled 2019-11-05: qty 20

## 2019-11-05 MED ORDER — ACETAMINOPHEN 325 MG PO TABS: 650.0000 mg | ORAL_TABLET | ORAL | Status: DC | PRN

## 2019-11-05 MED ORDER — LACTATED RINGERS IV SOLN: INTRAVENOUS | Status: DC

## 2019-11-05 SURGICAL SUPPLY — 50 items
BAG DECANTER FOR FLEXI CONT (MISCELLANEOUS) ×3 IMPLANT
BENZOIN TINCTURE PRP APPL 2/3 (GAUZE/BANDAGES/DRESSINGS) ×3 IMPLANT
BLADE CLIPPER SURG (BLADE) IMPLANT
CANISTER SUCT 3000ML PPV (MISCELLANEOUS) ×3 IMPLANT
CARTRIDGE OIL MAESTRO DRILL (MISCELLANEOUS) ×1 IMPLANT
CLOSURE STERI-STRIP 1/2X4 (GAUZE/BANDAGES/DRESSINGS) ×1
CLOSURE WOUND 1/2 X4 (GAUZE/BANDAGES/DRESSINGS) ×1
CLSR STERI-STRIP ANTIMIC 1/2X4 (GAUZE/BANDAGES/DRESSINGS) ×2 IMPLANT
COVER WAND RF STERILE (DRAPES) ×3 IMPLANT
DIFFUSER DRILL AIR PNEUMATIC (MISCELLANEOUS) IMPLANT
DRAPE LAPAROTOMY 100X72X124 (DRAPES) ×3 IMPLANT
DRAPE SURG 17X23 STRL (DRAPES) ×12 IMPLANT
DRSG OPSITE POSTOP 4X6 (GAUZE/BANDAGES/DRESSINGS) ×3 IMPLANT
ELECT BLADE 4.0 EZ CLEAN MEGAD (MISCELLANEOUS) ×3
ELECT REM PT RETURN 9FT ADLT (ELECTROSURGICAL) ×3
ELECTRODE BLDE 4.0 EZ CLN MEGD (MISCELLANEOUS) ×1 IMPLANT
ELECTRODE REM PT RTRN 9FT ADLT (ELECTROSURGICAL) ×1 IMPLANT
EVACUATOR 1/8 PVC DRAIN (DRAIN) ×3 IMPLANT
GAUZE 4X4 16PLY RFD (DISPOSABLE) IMPLANT
GAUZE SPONGE 4X4 12PLY STRL (GAUZE/BANDAGES/DRESSINGS) ×3 IMPLANT
GLOVE BIO SURGEON STRL SZ 6.5 (GLOVE) ×4 IMPLANT
GLOVE BIO SURGEON STRL SZ7 (GLOVE) ×3 IMPLANT
GLOVE BIO SURGEON STRL SZ8 (GLOVE) ×3 IMPLANT
GLOVE BIO SURGEON STRL SZ8.5 (GLOVE) ×3 IMPLANT
GLOVE BIO SURGEONS STRL SZ 6.5 (GLOVE) ×2
GLOVE BIOGEL PI IND STRL 6.5 (GLOVE) ×1 IMPLANT
GLOVE BIOGEL PI INDICATOR 6.5 (GLOVE) ×2
GLOVE EXAM NITRILE XL STR (GLOVE) IMPLANT
GOWN STRL REUS W/ TWL LRG LVL3 (GOWN DISPOSABLE) ×2 IMPLANT
GOWN STRL REUS W/ TWL XL LVL3 (GOWN DISPOSABLE) ×1 IMPLANT
GOWN STRL REUS W/TWL LRG LVL3 (GOWN DISPOSABLE) ×4
GOWN STRL REUS W/TWL XL LVL3 (GOWN DISPOSABLE) ×2
KIT BASIN OR (CUSTOM PROCEDURE TRAY) ×3 IMPLANT
KIT TURNOVER KIT B (KITS) ×3 IMPLANT
NEEDLE HYPO 22GX1.5 SAFETY (NEEDLE) ×3 IMPLANT
NS IRRIG 1000ML POUR BTL (IV SOLUTION) ×3 IMPLANT
OIL CARTRIDGE MAESTRO DRILL (MISCELLANEOUS) ×3
PACK LAMINECTOMY NEURO (CUSTOM PROCEDURE TRAY) ×3 IMPLANT
PAD ARMBOARD 7.5X6 YLW CONV (MISCELLANEOUS) ×6 IMPLANT
STRIP CLOSURE SKIN 1/2X4 (GAUZE/BANDAGES/DRESSINGS) ×2 IMPLANT
SUT ETHILON 2 0 PSLX (SUTURE) IMPLANT
SUT VIC AB 1 CT1 18XBRD ANBCTR (SUTURE) ×1 IMPLANT
SUT VIC AB 1 CT1 8-18 (SUTURE) ×2
SUT VIC AB 2-0 CP2 18 (SUTURE) ×3 IMPLANT
SWAB COLLECTION DEVICE MRSA (MISCELLANEOUS) IMPLANT
SWAB CULTURE ESWAB REG 1ML (MISCELLANEOUS) IMPLANT
SYR BULB IRRIG 60ML STRL (SYRINGE) ×3 IMPLANT
TOWEL GREEN STERILE (TOWEL DISPOSABLE) ×3 IMPLANT
TOWEL GREEN STERILE FF (TOWEL DISPOSABLE) ×3 IMPLANT
WATER STERILE IRR 1000ML POUR (IV SOLUTION) ×3 IMPLANT
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
## 2019-11-05 NOTE — Consult Note (Signed)
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 As above Blood pressure 95/69, pulse (!) 106, temperature 98 F (36.7 C), resp. rate 19, height 5\' 7"  (1.702 m), weight 54.4 kg, SpO2 96 %. Estimated body mass index is 18.79 kg/m as calculated from the following:   Height as of this encounter: 5\' 7"  (1.702 m).   Weight as of this encounter: 54.4 kg.  Physical Exam  General: An unkempt 49 year-old white female in a left arm sling complaining of back pain.  HEENT: Normocephalic, abrasions, her pupils are equal.  Extraocular muscles intact  Neck: Unremarkable.  She has limited range of motion.  Spurling's testing is negative.  Lhermitte sign was not present.  Thorax: Symmetric  Abdomen: Soft  Extremities: Her left upper extremity is in a sling  Back exam: She has an open lumbar wound  Neurologic exam: The patient is alert and oriented x3, Glasgow Coma Scale 15.  Cranial nerves II through XII were examined bilaterally and grossly normal.  Vision and hearing are grossly normal bilaterally.  Her motor strength was grossly  normal in her right upper extremity, bilateral gastrocnemius and dorsiflexors although she does giveaway.  There was limited exam of her left lower extremity as it was in a sling because of her fracture.  Cerebellar exam is intact to rapid alternating movements of the upper extremities bilaterally.  Sensory function is intact light touch sensation all distal dermatomes bilaterally.  Imaging studies: I have reviewed the patient's head CT performed at [facility].  It is unremarkable.  I have also reviewed her cervical CT performed [facility] today.  It demonstrates degenerative changes but no acute findings.  I have reviewed the patient's lumbar CT performed at [facility] today.  She has an open lumbar wound with subcutaneous emphysema and L4 and L5 spinous process fractures.  Assessment/Plan: Open lumbar spine fracture: I have discussed the situation with the patient and her sister via the telephone.  We discussed the various treatment options including doing nothing,  observation, and incision and drainage for debridement of her wound.  I recommend the surgery to decrease the chance of infection.  I described the surgery to her.  We have discussed the risks, benefits, alternatives, expected postop course, and likelihood of achieving our goals with surgery.  I have answered all her questions.  She has decided to proceed with surgery.  Cristi Loron 11/05/2019, 12:29 PM

## 2019-11-05 NOTE — ED Provider Notes (Signed)
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 vital signs remained stable.  CT scans showed evidence of spinous process fractures of the lower lumbar and sacral spine.  She also has comminuted fracture of the iliac crest and a proximal humerus fracture.  She was given Ancef and fentanyl in the ED.  Dr. Fredricka Bonine with trauma surgery has seen the patient.  I spoke with Dr. Susa Simmonds with orthopedic surgery who states that the humerus fracture will likely need operative repair but will be delayed due to it being out of his scope.  Dr Lovell Sheehan with neurosurgery will take the pt to OR for washout of the back wound which appears to connect with the spinous process fx.  CRITICAL CARE Performed by: Rolan Bucco Total critical care time: 60 minutes Critical care time was exclusive of separately billable procedures and treating other patients. Critical care was necessary to treat or prevent imminent or life-threatening deterioration. Critical care was time spent personally by me on the following activities: development of treatment plan with patient and/or surrogate as well as nursing, discussions with consultants, evaluation of patient's response to treatment, examination of patient, obtaining history from  patient or surrogate, ordering and performing treatments and interventions, ordering and review of laboratory studies, ordering and review of radiographic studies, pulse oximetry and re-evaluation of patient's condition.  Final Clinical Impression(s) / ED Diagnoses Final diagnoses:  Trauma  Other closed displaced fracture of proximal end of left humerus, initial encounter  Closed fracture of spinous process of lumbar vertebra, initial encounter (HCC)  Closed fracture of left iliac crest, initial encounter [facility])    Rx / DC Orders ED Discharge Orders    None       Rolan Bucco, MD 11/05/19 1034

## 2019-11-05 NOTE — Transfer of Care (Signed)
Immediate Anesthesia Transfer of Care Note  Patient: Charlene Woods  Procedure(s) Performed: Incision and Drainage of Open Lumbar Spine Fracture (N/A Back)  Patient Location: PACU  Anesthesia Type:General  Level of Consciousness: drowsy  Airway & Oxygen Therapy: Patient Spontanous Breathing  Post-op Assessment: Report given to RN and Post -op Vital signs reviewed and stable  Post vital signs: Reviewed and stable  Last Vitals:  Vitals Value Taken Time  BP 131/90 11/05/19 1420  Temp    Pulse 102 11/05/19 1426  Resp 16 11/05/19 1426  SpO2 92 % 11/05/19 1426  Vitals shown include unvalidated device data.  Last Pain:  Vitals:   11/05/19 1420  PainSc: Asleep         Complications: No complications documented.

## 2019-11-05 NOTE — Anesthesia Preprocedure Evaluation (Signed)
Anesthesia Evaluation  Patient identified by MRN, date of birth, ID band Patient awake    Reviewed: Allergy & Precautions, NPO status , Patient's Chart, lab work & pertinent test results  Airway Mallampati: I  TM Distance: <3 FB Neck ROM: Full    Dental  (+) Edentulous Upper, Edentulous Lower   Pulmonary neg pulmonary ROS,     + decreased breath sounds      Cardiovascular negative cardio ROS   Rhythm:Regular Rate:Normal     Neuro/Psych negative neurological ROS  negative psych ROS   GI/Hepatic negative GI ROS, Neg liver ROS,   Endo/Other  negative endocrine ROS  Renal/GU negative Renal ROS     Musculoskeletal negative musculoskeletal ROS (+)   Abdominal Normal abdominal exam  (+)   Peds  Hematology negative hematology ROS (+)   Anesthesia Other Findings No C collar present  L Arm splint  Reproductive/Obstetrics                             Anesthesia Physical Anesthesia Plan  ASA: III  Anesthesia Plan: General   Post-op Pain Management:    Induction: Intravenous, Rapid sequence and Cricoid pressure planned  PONV Risk Score and Plan: 4 or greater and Ondansetron, Dexamethasone, Midazolam, Scopolamine patch - Pre-op and Treatment may vary due to age or medical condition  Airway Management Planned: Oral ETT  Additional Equipment: None  Intra-op Plan:   Post-operative Plan: Extubation in OR  Informed Consent: I have reviewed the patients History and Physical, chart, labs and discussed the procedure including the risks, benefits and alternatives for the proposed anesthesia with the patient or authorized representative who has indicated his/her understanding and acceptance.     Dental advisory given  Plan Discussed with: CRNA  Anesthesia Plan Comments: (Does not have PCP  Covid-19 Nucleic Acid Test Results Lab Results      Component                Value               Date                       [id number]              NEGATIVE            11/05/2019            Lab Results      Component                Value               Date                      WBC                      7.9                 11/05/2019                HGB                      16.0 (H)            11/05/2019                HCT  47.0 (H)            11/05/2019                MCV                      106.9 (H)           11/05/2019                PLT                      337                 11/05/2019           )        Anesthesia Quick Evaluation

## 2019-11-05 NOTE — Progress Notes (Signed)
Orthopedic Tech Progress Note Patient Details:  Charlene Woods 26-Jul-1970 [id number]  Ortho Devices Type of Ortho Device: Shoulder immobilizer Ortho Device/Splint Location: LUE Ortho Device/Splint Interventions: Ordered, Application, Adjustment   Post Interventions Patient Tolerated: Fair Instructions Provided: Care of device, Adjustment of device, Poper ambulation with device   [name] 11/05/2019, 10:04 AM

## 2019-11-05 NOTE — Anesthesia Procedure Notes (Signed)
Procedure Name: Intubation Date/Time: 11/05/2019 12:54 PM Performed by: Kyung Rudd, CRNA Pre-anesthesia Checklist: Patient identified, Emergency Drugs available, Suction available and Patient being monitored Patient Re-evaluated:Patient Re-evaluated prior to induction Oxygen Delivery Method: Circle system utilized Preoxygenation: Pre-oxygenation with 100% oxygen Induction Type: IV induction, Rapid sequence and Cricoid Pressure applied Laryngoscope Size: Mac and 3 Grade View: Grade I Tube type: Oral Tube size: 7.0 mm Number of attempts: 1 Airway Equipment and Method: Stylet Placement Confirmation: ETT inserted through vocal cords under direct vision,  positive ETCO2 and breath sounds checked- equal and bilateral Secured at: 21 cm Tube secured with: Tape Dental Injury: Teeth and Oropharynx as per pre-operative assessment
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
## 2019-11-05 NOTE — Anesthesia Postprocedure Evaluation (Signed)
Anesthesia Post Note  Patient: Charlene Woods  Procedure(s) Performed: Incision and Drainage of Open Lumbar Spine Fracture (N/A Back)     Patient location during evaluation: PACU Anesthesia Type: General Level of consciousness: awake and alert Pain management: pain level controlled Vital Signs Assessment: post-procedure vital signs reviewed and stable Respiratory status: spontaneous breathing, nonlabored ventilation, respiratory function stable and patient connected to nasal cannula oxygen Cardiovascular status: blood pressure returned to baseline and stable Postop Assessment: no apparent nausea or vomiting Anesthetic complications: no   No complications documented.  Last Vitals:  Vitals:   11/05/19 1530 11/05/19 1548  BP: 103/74 109/80  Pulse: (!) 102 93  Resp: (!) 23 13  Temp:  36.9 C  SpO2: 92% 94%    Last Pain:  Vitals:   11/05/19 1700  TempSrc:   PainSc: 0-No pain                 Shelton Silvas
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
## 2019-11-05 NOTE — Progress Notes (Signed)
Orthopedic Tech Progress Note Patient Details:  Charlene Woods April 23, 1970 [id number] Level 1 Trauma  Patient ID: [name], female   DOB: [birth year], 49 y.o.   MRN: [id number]   [name] 11/05/2019, 7:17 AM

## 2019-11-06 ENCOUNTER — Encounter (HOSPITAL_COMMUNITY): Payer: Self-pay | Admitting: Neurosurgery

## 2019-11-06 LAB — BASIC METABOLIC PANEL
Anion gap: 8 (ref 5–15)
BUN: 10 mg/dL (ref 6–20)
CO2: 22 mmol/L (ref 22–32)
Calcium: 7.9 mg/dL — ABNORMAL LOW (ref 8.9–10.3)
Chloride: 105 mmol/L (ref 98–111)
Creatinine, Ser: 0.75 mg/dL (ref 0.44–1.00)
GFR calc Af Amer: >60 mL/min (ref >60)
GFR calc non Af Amer: >60 mL/min (ref >60)
Glucose, Bld: 134 mg/dL — ABNORMAL HIGH (ref 70–99)
Potassium: 4 mmol/L (ref 3.5–5.1)
Sodium: 135 mmol/L (ref 135–145)

## 2019-11-06 LAB — GLUCOSE, CAPILLARY: Glucose-Capillary: 109 mg/dL — ABNORMAL HIGH (ref 70–99)

## 2019-11-06 LAB — CBC
HCT: 31 % — ABNORMAL LOW (ref 36.0–46.0)
Hemoglobin: 10.2 g/dL — ABNORMAL LOW (ref 12.0–15.0)
MCH: 35.1 pg — ABNORMAL HIGH (ref 26.0–34.0)
MCHC: 32.9 g/dL (ref 30.0–36.0)
MCV: 106.5 fL — ABNORMAL HIGH (ref 80.0–100.0)
Platelets: 199 10*3/uL (ref 150–400)
RBC: 2.91 MIL/uL — ABNORMAL LOW (ref 3.87–5.11)
RDW: 11.6 % (ref 11.5–15.5)
WBC: 11.5 10*3/uL — ABNORMAL HIGH (ref 4.0–10.5)
nRBC: 0 % (ref 0.0–0.2)

## 2019-11-06 LAB — HIV ANTIBODY (ROUTINE TESTING W REFLEX): HIV Screen 4th Generation wRfx: NONREACTIVE

## 2019-11-06 MED ORDER — CYCLOBENZAPRINE HCL 10 MG PO TABS
10.0000 mg | ORAL_TABLET | Freq: Three times a day (TID) | ORAL | Status: DC
  Administered 2019-11-06 – 2019-11-11 (×14): 10 mg via ORAL
  Filled 2019-11-06 (×14): qty 1

## 2019-11-06 MED ORDER — AMLODIPINE BESYLATE 5 MG PO TABS
5.0000 mg | ORAL_TABLET | Freq: Every day | ORAL | Status: DC
  Administered 2019-11-08 – 2019-11-10 (×3): 5 mg via ORAL
  Filled 2019-11-06 (×5): qty 1

## 2019-11-06 MED ORDER — SPIRITUS FRUMENTI
1.0000 | Freq: Three times a day (TID) | ORAL | Status: DC
  Administered 2019-11-06 – 2019-11-11 (×12): 1 via ORAL
  Filled 2019-11-06 (×19): qty 1

## 2019-11-06 MED ORDER — ENOXAPARIN SODIUM 30 MG/0.3ML ~~LOC~~ SOLN
30.0000 mg | Freq: Two times a day (BID) | SUBCUTANEOUS | Status: DC
  Administered 2019-11-06 – 2019-11-10 (×9): 30 mg via SUBCUTANEOUS
  Filled 2019-11-06 (×9): qty 0.3

## 2019-11-06 MED ORDER — THIAMINE HCL 100 MG/ML IJ SOLN
100.0000 mg | Freq: Every day | INTRAMUSCULAR | Status: DC
  Administered 2019-11-06: 100 mg via INTRAVENOUS
  Filled 2019-11-06 (×2): qty 2

## 2019-11-06 MED ORDER — THIAMINE HCL 100 MG PO TABS
100.0000 mg | ORAL_TABLET | Freq: Every day | ORAL | Status: DC
  Administered 2019-11-07 – 2019-11-11 (×4): 100 mg via ORAL
  Filled 2019-11-06 (×4): qty 1

## 2019-11-06 MED ORDER — ALBUTEROL SULFATE (2.5 MG/3ML) 0.083% IN NEBU: 3.0000 mL | INHALATION_SOLUTION | RESPIRATORY_TRACT | Status: DC | PRN

## 2019-11-06 MED ORDER — ADULT MULTIVITAMIN W/MINERALS CH
1.0000 | ORAL_TABLET | Freq: Every day | ORAL | Status: DC
  Administered 2019-11-06 – 2019-11-11 (×5): 1 via ORAL
  Filled 2019-11-06 (×5): qty 1

## 2019-11-06 MED ORDER — GUAIFENESIN ER 600 MG PO TB12: 600.0000 mg | ORAL_TABLET | Freq: Two times a day (BID) | ORAL | Status: DC | PRN

## 2019-11-06 MED ORDER — POLYETHYLENE GLYCOL 3350 17 G PO PACK
17.0000 g | PACK | Freq: Every day | ORAL | Status: DC
  Administered 2019-11-06 – 2019-11-10 (×4): 17 g via ORAL
  Filled 2019-11-06 (×4): qty 1

## 2019-11-06 MED ORDER — BACITRACIN ZINC 500 UNIT/GM EX OINT
TOPICAL_OINTMENT | Freq: Two times a day (BID) | CUTANEOUS | Status: DC
  Filled 2019-11-06: qty 28.4

## 2019-11-06 MED ORDER — FOLIC ACID 1 MG PO TABS
1.0000 mg | ORAL_TABLET | Freq: Every day | ORAL | Status: DC
  Administered 2019-11-06 – 2019-11-11 (×5): 1 mg via ORAL
  Filled 2019-11-06 (×5): qty 1

## 2019-11-06 MED ORDER — LORAZEPAM 1 MG PO TABS: 1.0000 mg | ORAL_TABLET | ORAL | Status: AC | PRN

## 2019-11-06 MED ORDER — LORAZEPAM 2 MG/ML IJ SOLN: 1.0000 mg | INTRAMUSCULAR | Status: AC | PRN
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
## 2019-11-06 NOTE — Progress Notes (Signed)
   Providing Compassionate, Quality Care - Together  NEUROSURGERY PROGRESS NOTE   S: No issues overnight. Doing well  O: EXAM:  BP (!) 110/99 (BP Location: Right Arm)   Pulse 71   Temp 98.8 F (37.1 C) (Oral)   Resp 20   Ht 5\' 7"  (1.702 m)   Wt 54.4 kg   SpO2 100%   BMI 18.79 kg/m   Awake, alert, oriented  Speech fluent, appropriate  Cns grossly intact  5/5 BUE/BLE  Incision c/d/i  ASSESSMENT:  49 y.o. female with  Open L4 and L5 spinous process fracture  S/p open I/D POD#1  PLAN: - pt/ot - dc jp - neuro checks - ok for therapy/oob from nsx standpoint    Thank you for allowing me to participate in this patient's care.  Please do not hesitate to call with questions or concerns.   49, DO Neurosurgeon [facility] Cell: [phone]
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
## 2019-11-07 LAB — BASIC METABOLIC PANEL
Anion gap: 8 (ref 5–15)
BUN: 9 mg/dL (ref 6–20)
CO2: 23 mmol/L (ref 22–32)
Calcium: 7.8 mg/dL — ABNORMAL LOW (ref 8.9–10.3)
Chloride: 106 mmol/L (ref 98–111)
Creatinine, Ser: 0.65 mg/dL (ref 0.44–1.00)
GFR calc Af Amer: >60 mL/min (ref >60)
GFR calc non Af Amer: >60 mL/min (ref >60)
Glucose, Bld: 96 mg/dL (ref 70–99)
Potassium: 3.7 mmol/L (ref 3.5–5.1)
Sodium: 137 mmol/L (ref 135–145)

## 2019-11-07 LAB — CBC
HCT: 26.7 % — ABNORMAL LOW (ref 36.0–46.0)
Hemoglobin: 8.9 g/dL — ABNORMAL LOW (ref 12.0–15.0)
MCH: 35.7 pg — ABNORMAL HIGH (ref 26.0–34.0)
MCHC: 33.3 g/dL (ref 30.0–36.0)
MCV: 107.2 fL — ABNORMAL HIGH (ref 80.0–100.0)
Platelets: 157 10*3/uL (ref 150–400)
RBC: 2.49 MIL/uL — ABNORMAL LOW (ref 3.87–5.11)
RDW: 11.7 % (ref 11.5–15.5)
WBC: 7.8 10*3/uL (ref 4.0–10.5)
nRBC: 0 % (ref 0.0–0.2)

## 2019-11-07 LAB — MAGNESIUM: Magnesium: 1.9 mg/dL (ref 1.7–2.4)

## 2019-11-07 NOTE — Progress Notes (Signed)
   Providing Compassionate, Quality Care - Together  NEUROSURGERY PROGRESS NOTE   S: No issues overnight.   O: EXAM:  BP 93/68 (BP Location: Right Arm)   Pulse (!) 59   Temp 99 F (37.2 C) (Oral)   Resp 20   Ht 5\' 7"  (1.702 m)   Wt 54.4 kg   SpO2 (!) 87%   BMI 18.79 kg/m   Awake, alert, oriented  Speech fluent, appropriate  CN grossly intact  5/5 BUE/BLE  Incision c/d/i  ASSESSMENT:  49 y.o. female with  Open L4 and L5 spinous process fracture  S/p open I/D POD#2  PLAN: - pt/ot - neuro checks - ok for therapy/oob from nsx standpoint    Thank you for allowing me to participate in this patient's care.  Please do not hesitate to call with questions or concerns.   59, DO Neurosurgeon [facility] Cell: [phone]

## 2019-11-07 NOTE — Plan of Care (Signed)
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
## 2019-11-08 LAB — POCT I-STAT, CHEM 8
BUN: 5 mg/dL — ABNORMAL LOW (ref 6–20)
Calcium, Ion: 0.89 mmol/L — CL (ref 1.15–1.40)
Chloride: 109 mmol/L (ref 98–111)
Creatinine, Ser: 0.9 mg/dL (ref 0.44–1.00)
Glucose, Bld: 131 mg/dL — ABNORMAL HIGH (ref 70–99)
HCT: 47 % — ABNORMAL HIGH (ref 36.0–46.0)
Hemoglobin: 16 g/dL — ABNORMAL HIGH (ref 12.0–15.0)
Potassium: 7.8 mmol/L (ref 3.5–5.1)
Sodium: 137 mmol/L (ref 135–145)
TCO2: 21 mmol/L — ABNORMAL LOW (ref 22–32)

## 2019-11-08 LAB — CBC
HCT: 26.2 % — ABNORMAL LOW (ref 36.0–46.0)
Hemoglobin: 9 g/dL — ABNORMAL LOW (ref 12.0–15.0)
MCH: 36.1 pg — ABNORMAL HIGH (ref 26.0–34.0)
MCHC: 34.4 g/dL (ref 30.0–36.0)
MCV: 105.2 fL — ABNORMAL HIGH (ref 80.0–100.0)
Platelets: 179 10*3/uL (ref 150–400)
RBC: 2.49 MIL/uL — ABNORMAL LOW (ref 3.87–5.11)
RDW: 11.3 % — ABNORMAL LOW (ref 11.5–15.5)
WBC: 6.2 10*3/uL (ref 4.0–10.5)
nRBC: 0 % (ref 0.0–0.2)

## 2019-11-08 LAB — SURGICAL PCR SCREEN
MRSA, PCR: NEGATIVE
Staphylococcus aureus: NEGATIVE

## 2019-11-08 MED ORDER — GUAIFENESIN ER 600 MG PO TB12
600.0000 mg | ORAL_TABLET | Freq: Two times a day (BID) | ORAL | Status: AC
  Administered 2019-11-08 – 2019-11-09 (×3): 600 mg via ORAL
  Filled 2019-11-08 (×3): qty 1

## 2019-11-08 MED ORDER — MUPIROCIN 2 % EX OINT
1.0000 "application " | TOPICAL_OINTMENT | Freq: Two times a day (BID) | CUTANEOUS | Status: DC
  Administered 2019-11-08 – 2019-11-11 (×6): 1 via NASAL
  Filled 2019-11-08 (×2): qty 22
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
## 2019-11-08 NOTE — Anesthesia Preprocedure Evaluation (Addendum)
Anesthesia Evaluation  Patient identified by MRN, date of birth, ID band Patient awake    Reviewed: Allergy & Precautions, NPO status , Patient's Chart, lab work & pertinent test results  Airway Mallampati: II  TM Distance: >3 FB Neck ROM: Full    Dental no notable dental hx. (+) Edentulous Lower, Edentulous Upper   Pulmonary COPD, Current Smoker and Patient abstained from smoking.,    Pulmonary exam normal breath sounds clear to auscultation       Cardiovascular hypertension, Pt. on medications Normal cardiovascular exam Rhythm:Regular Rate:Normal     Neuro/Psych negative neurological ROS  negative psych ROS   GI/Hepatic (+)     substance abuse  alcohol use and cocaine use,   Endo/Other    Renal/GU K+ 3.7 Cr 0.65     Musculoskeletal   Abdominal   Peds  Hematology Hgb 9.0 Plt 179   Anesthesia Other Findings   Reproductive/Obstetrics                            Anesthesia Physical Anesthesia Plan  ASA: III  Anesthesia Plan: General   Post-op Pain Management:    Induction: Intravenous  PONV Risk Score and Plan: 3 and Treatment may vary due to age or medical condition, Ondansetron, Dexamethasone and Midazolam  Airway Management Planned: Oral ETT  Additional Equipment: None  Intra-op Plan:   Post-operative Plan: Extubation in OR  Informed Consent: I have reviewed the patients History and Physical, chart, labs and discussed the procedure including the risks, benefits and alternatives for the proposed anesthesia with the patient or authorized representative who has indicated his/her understanding and acceptance.     Dental advisory given  Plan Discussed with: CRNA and Anesthesiologist  Anesthesia Plan Comments: (GA w L ISB)       Anesthesia Quick Evaluation

## 2019-11-08 NOTE — Progress Notes (Signed)
Patient ID: Charlene Woods, female   DOB: 1970-11-03, 49 y.o.   MRN: [id number] Subjective: The patient is alert and pleasant.  Her left humerus surgery is scheduled for tomorrow.  Objective: Vital signs in last 24 hours: Temp:  [98.1 F (36.7 C)-99.9 F (37.7 C)] 98.8 F (37.1 C) (09/07 0722) Pulse Rate:  [59-87] 87 (09/07 0722) Resp:  [14-22] 22 (09/07 0722) BP: (93-128)/(62-92) 128/92 (09/07 0722) SpO2:  [87 %-99 %] 90 % (09/07 0722) Estimated body mass index is 18.79 kg/m as calculated from the following:   Height as of this encounter: 5\' 7"  (1.702 m).   Weight as of this encounter: 54.4 kg.   Intake/Output from previous day: 09/06 0701 - 09/07 0700 In: 720 [P.O.:720] Out: -  Intake/Output this shift: No intake/output data recorded.  Physical exam patient is alert and oriented.  She is moving her lower extremities well.  Lab Results: Recent Labs    11/07/19 0145 11/08/19 0448  WBC 7.8 6.2  HGB 8.9* 9.0*  HCT 26.7* 26.2*  PLT 157 179   BMET Recent Labs    11/06/19 0622 11/07/19 0145  NA 135 137  K 4.0 3.7  CL 105 106  CO2 22 23  GLUCOSE 134* 96  BUN 10 9  CREATININE 0.75 0.65  CALCIUM 7.9* 7.8*    Studies/Results: No results found.  Assessment/Plan: Postop day #3: The patient is doing well from a neurosurgical point of view.  I will sign off.  Please have her follow-up with me in the office in a few weeks.  LOS: 3 days     01/07/20 11/08/2019, 7:24 AM

## 2019-11-09 ENCOUNTER — Inpatient Hospital Stay (HOSPITAL_COMMUNITY): Payer: Medicaid Other

## 2019-11-09 ENCOUNTER — Encounter (HOSPITAL_COMMUNITY): Payer: Self-pay

## 2019-11-09 ENCOUNTER — Inpatient Hospital Stay (HOSPITAL_COMMUNITY): Payer: Medicaid Other | Admitting: Anesthesiology

## 2019-11-09 ENCOUNTER — Encounter (HOSPITAL_COMMUNITY): Admission: EM | Disposition: A | Discharge status: Home / Self Care | Payer: Self-pay | Source: Home / Self Care

## 2019-11-09 DIAGNOSIS — S42202A Unspecified fracture of upper end of left humerus, initial encounter for closed fracture: Secondary | ICD-10-CM

## 2019-11-09 DIAGNOSIS — S32009B Unspecified fracture of unspecified lumbar vertebra, initial encounter for open fracture: Secondary | ICD-10-CM

## 2019-11-09 DIAGNOSIS — F1010 Alcohol abuse, uncomplicated: Secondary | ICD-10-CM

## 2019-11-09 DIAGNOSIS — F17200 Nicotine dependence, unspecified, uncomplicated: Secondary | ICD-10-CM

## 2019-11-09 DIAGNOSIS — S32302B Unspecified fracture of left ilium, initial encounter for open fracture: Secondary | ICD-10-CM

## 2019-11-09 HISTORY — PX: ORIF HUMERUS FRACTURE: SHX2126

## 2019-11-09 LAB — URINALYSIS, ROUTINE W REFLEX MICROSCOPIC
Bilirubin Urine: NEGATIVE
Glucose, UA: NEGATIVE mg/dL
Hgb urine dipstick: NEGATIVE
Ketones, ur: NEGATIVE mg/dL
Nitrite: NEGATIVE
Protein, ur: NEGATIVE mg/dL
Specific Gravity, Urine: 1.008 (ref 1.005–1.030)
pH: 8 (ref 5.0–8.0)

## 2019-11-09 LAB — PREGNANCY, URINE: Preg Test, Ur: NEGATIVE

## 2019-11-09 SURGERY — OPEN REDUCTION INTERNAL FIXATION (ORIF) PROXIMAL HUMERUS FRACTURE
Anesthesia: General | Laterality: Left

## 2019-11-09 MED ORDER — CHLORHEXIDINE GLUCONATE 0.12 % MT SOLN
OROMUCOSAL | Status: AC
  Administered 2019-11-09: 15 mL via OROMUCOSAL
  Filled 2019-11-09: qty 15

## 2019-11-09 MED ORDER — LORAZEPAM 1 MG PO TABS: 1.0000 mg | ORAL_TABLET | ORAL | Status: DC | PRN

## 2019-11-09 MED ORDER — BUPIVACAINE LIPOSOME 1.3 % IJ SUSP
INTRAMUSCULAR | Status: DC | PRN
  Administered 2019-11-09: 10 mL via PERINEURAL

## 2019-11-09 MED ORDER — LACTATED RINGERS IV SOLN: INTRAVENOUS | Status: DC

## 2019-11-09 MED ORDER — LIDOCAINE 2% (20 MG/ML) 5 ML SYRINGE
INTRAMUSCULAR | Status: DC | PRN
  Administered 2019-11-09: 60 mg via INTRAVENOUS

## 2019-11-09 MED ORDER — DEXMEDETOMIDINE (PRECEDEX) IN NS 20 MCG/5ML (4 MCG/ML) IV SYRINGE
PREFILLED_SYRINGE | INTRAVENOUS | Status: DC | PRN
  Administered 2019-11-09: 12 ug via INTRAVENOUS

## 2019-11-09 MED ORDER — PROPOFOL 10 MG/ML IV BOLUS
INTRAVENOUS | Status: AC
  Filled 2019-11-09: qty 20

## 2019-11-09 MED ORDER — PHENYLEPHRINE HCL-NACL 10-0.9 MG/250ML-% IV SOLN
INTRAVENOUS | Status: DC | PRN
  Administered 2019-11-09: 20 ug/min via INTRAVENOUS

## 2019-11-09 MED ORDER — BUPIVACAINE-EPINEPHRINE (PF) 0.5% -1:200000 IJ SOLN
INTRAMUSCULAR | Status: DC | PRN
  Administered 2019-11-09: 15 mL via PERINEURAL

## 2019-11-09 MED ORDER — FENTANYL CITRATE (PF) 100 MCG/2ML IJ SOLN: 25.0000 ug | INTRAMUSCULAR | Status: DC | PRN

## 2019-11-09 MED ORDER — FENTANYL CITRATE (PF) 100 MCG/2ML IJ SOLN
INTRAMUSCULAR | Status: AC
  Administered 2019-11-09: 100 ug via INTRAVENOUS
  Filled 2019-11-09: qty 2

## 2019-11-09 MED ORDER — CHLORHEXIDINE GLUCONATE 0.12 % MT SOLN: 15.0000 mL | Freq: Once | OROMUCOSAL | Status: AC

## 2019-11-09 MED ORDER — ARTIFICIAL TEARS OPHTHALMIC OINT
TOPICAL_OINTMENT | OPHTHALMIC | Status: AC
  Filled 2019-11-09: qty 3.5

## 2019-11-09 MED ORDER — PROPOFOL 10 MG/ML IV BOLUS
INTRAVENOUS | Status: DC | PRN
  Administered 2019-11-09: 100 mg via INTRAVENOUS

## 2019-11-09 MED ORDER — MIDAZOLAM HCL 2 MG/2ML IJ SOLN
INTRAMUSCULAR | Status: AC
  Administered 2019-11-09: 2 mg via INTRAVENOUS
  Filled 2019-11-09: qty 2

## 2019-11-09 MED ORDER — SUGAMMADEX SODIUM 200 MG/2ML IV SOLN
INTRAVENOUS | Status: DC | PRN
  Administered 2019-11-09: 180 mg via INTRAVENOUS

## 2019-11-09 MED ORDER — VANCOMYCIN HCL 1000 MG IV SOLR
INTRAVENOUS | Status: DC | PRN
  Administered 2019-11-09: 1000 mg

## 2019-11-09 MED ORDER — MIDAZOLAM HCL 2 MG/2ML IJ SOLN
INTRAMUSCULAR | Status: AC
  Filled 2019-11-09: qty 2

## 2019-11-09 MED ORDER — CEFAZOLIN SODIUM-DEXTROSE 2-4 GM/100ML-% IV SOLN
2.0000 g | Freq: Three times a day (TID) | INTRAVENOUS | Status: AC
  Administered 2019-11-09 – 2019-11-10 (×3): 2 g via INTRAVENOUS
  Filled 2019-11-09 (×3): qty 100

## 2019-11-09 MED ORDER — TOBRAMYCIN SULFATE 1.2 G IJ SOLR
INTRAMUSCULAR | Status: AC
  Filled 2019-11-09: qty 1.2

## 2019-11-09 MED ORDER — FENTANYL CITRATE (PF) 250 MCG/5ML IJ SOLN
INTRAMUSCULAR | Status: DC | PRN
Start: 2019-11-09 — End: 2019-11-09
  Administered 2019-11-09: 50 ug via INTRAVENOUS

## 2019-11-09 MED ORDER — ONDANSETRON HCL 4 MG/2ML IJ SOLN
INTRAMUSCULAR | Status: DC | PRN
  Administered 2019-11-09 (×2): 4 mg via INTRAVENOUS

## 2019-11-09 MED ORDER — CEFAZOLIN SODIUM-DEXTROSE 2-4 GM/100ML-% IV SOLN
INTRAVENOUS | Status: AC
  Administered 2019-11-09: 2000 mg
  Filled 2019-11-09: qty 100

## 2019-11-09 MED ORDER — VANCOMYCIN HCL 1000 MG IV SOLR
INTRAVENOUS | Status: AC
  Filled 2019-11-09: qty 1000

## 2019-11-09 MED ORDER — ACETAMINOPHEN 10 MG/ML IV SOLN
INTRAVENOUS | Status: AC
  Administered 2019-11-09: 1000 mg via INTRAVENOUS
  Filled 2019-11-09: qty 100

## 2019-11-09 MED ORDER — FENTANYL CITRATE (PF) 250 MCG/5ML IJ SOLN
INTRAMUSCULAR | Status: AC
  Filled 2019-11-09: qty 5

## 2019-11-09 MED ORDER — ONDANSETRON HCL 4 MG/2ML IJ SOLN
INTRAMUSCULAR | Status: AC
  Filled 2019-11-09: qty 2

## 2019-11-09 MED ORDER — LORAZEPAM 2 MG/ML IJ SOLN: 1.0000 mg | INTRAMUSCULAR | Status: DC | PRN

## 2019-11-09 MED ORDER — MIDAZOLAM HCL 2 MG/2ML IJ SOLN: 2.0000 mg | Freq: Once | INTRAMUSCULAR | Status: AC

## 2019-11-09 MED ORDER — ONDANSETRON HCL 4 MG/2ML IJ SOLN: 4.0000 mg | Freq: Once | INTRAMUSCULAR | Status: DC | PRN

## 2019-11-09 MED ORDER — CEFAZOLIN SODIUM-DEXTROSE 2-3 GM-%(50ML) IV SOLR
INTRAVENOUS | Status: DC | PRN
  Administered 2019-11-09: 2 g via INTRAVENOUS

## 2019-11-09 MED ORDER — ACETAMINOPHEN 10 MG/ML IV SOLN: 1000.0000 mg | Freq: Once | INTRAVENOUS | Status: DC | PRN

## 2019-11-09 MED ORDER — FENTANYL CITRATE (PF) 100 MCG/2ML IJ SOLN
INTRAMUSCULAR | Status: AC
  Administered 2019-11-09: 50 ug via INTRAVENOUS
  Filled 2019-11-09: qty 2

## 2019-11-09 MED ORDER — FENTANYL CITRATE (PF) 100 MCG/2ML IJ SOLN: 100.0000 ug | Freq: Once | INTRAMUSCULAR | Status: AC

## 2019-11-09 MED ORDER — 0.9 % SODIUM CHLORIDE (POUR BTL) OPTIME
TOPICAL | Status: DC | PRN
  Administered 2019-11-09: 1000 mL

## 2019-11-09 MED ORDER — DEXAMETHASONE SODIUM PHOSPHATE 10 MG/ML IJ SOLN
INTRAMUSCULAR | Status: DC | PRN
  Administered 2019-11-09: 10 mg via INTRAVENOUS

## 2019-11-09 MED ORDER — ROCURONIUM BROMIDE 10 MG/ML (PF) SYRINGE
PREFILLED_SYRINGE | INTRAVENOUS | Status: DC | PRN
  Administered 2019-11-09: 20 mg via INTRAVENOUS
  Administered 2019-11-09: 40 mg via INTRAVENOUS

## 2019-11-09 SURGICAL SUPPLY — 69 items
BIT DRILL 2.5MM SMALL QC EVOS (BIT) IMPLANT
BIT DRILL QC 2.5MM SHRT EVO SM (DRILL) IMPLANT
BNDG ELASTIC 6X5.8 VLCR STR LF (GAUZE/BANDAGES/DRESSINGS) ×3 IMPLANT
BRUSH SCRUB EZ PLAIN DRY (MISCELLANEOUS) ×6 IMPLANT
CHLORAPREP W/TINT 26 (MISCELLANEOUS) ×3 IMPLANT
COVER SURGICAL LIGHT HANDLE (MISCELLANEOUS) ×6 IMPLANT
COVER WAND RF STERILE (DRAPES) ×3 IMPLANT
DERMABOND ADHESIVE PROPEN (GAUZE/BANDAGES/DRESSINGS) ×2
DERMABOND ADVANCED .7 DNX6 (GAUZE/BANDAGES/DRESSINGS) IMPLANT
DRAPE C-ARM 42X72 X-RAY (DRAPES) ×3 IMPLANT
DRAPE HALF SHEET 40X57 (DRAPES) ×3 IMPLANT
DRAPE SURG 17X23 STRL (DRAPES) ×6 IMPLANT
DRAPE U-SHAPE 47X51 STRL (DRAPES) ×6 IMPLANT
DRILL 2.5MM SMALL QC EVOS (BIT) ×3
DRILL QC 2.5MM SHORT EVOS SM (DRILL) ×3
DRSG MEPILEX BORDER 4X8 (GAUZE/BANDAGES/DRESSINGS) ×3 IMPLANT
ELECT REM PT RETURN 9FT ADLT (ELECTROSURGICAL) ×3
ELECTRODE REM PT RTRN 9FT ADLT (ELECTROSURGICAL) ×1 IMPLANT
GLOVE BIO SURGEON STRL SZ 6.5 (GLOVE) ×6 IMPLANT
GLOVE BIO SURGEON STRL SZ7.5 (GLOVE) ×9 IMPLANT
GLOVE BIO SURGEONS STRL SZ 6.5 (GLOVE) ×3
GLOVE BIOGEL PI IND STRL 6.5 (GLOVE) ×1 IMPLANT
GLOVE BIOGEL PI IND STRL 7.5 (GLOVE) ×1 IMPLANT
GLOVE BIOGEL PI INDICATOR 6.5 (GLOVE) ×2
GLOVE BIOGEL PI INDICATOR 7.5 (GLOVE) ×2
GOWN STRL REUS W/ TWL LRG LVL3 (GOWN DISPOSABLE) ×2 IMPLANT
GOWN STRL REUS W/TWL LRG LVL3 (GOWN DISPOSABLE) ×4
GRAFT FIBULA 6CM (Bone Implant) ×2 IMPLANT
K-WIRE 1.6 (WIRE) ×2
K-WIRE 2.0 (WIRE) ×2
K-WIRE FX150X1.6XTROC PNT (WIRE) ×1
K-WIRE TROCAR PT 2.0 150MM (WIRE) ×1
KIT BASIN OR (CUSTOM PROCEDURE TRAY) ×3 IMPLANT
KIT TURNOVER KIT B (KITS) ×3 IMPLANT
KWIRE FX150X1.6XTROC PNT (WIRE) IMPLANT
KWIRE TROCAR PT 2.0 150 (WIRE) IMPLANT
KWIRE TROCAR PT 2.0 150MM (WIRE) ×1 IMPLANT
MANIFOLD NEPTUNE II (INSTRUMENTS) ×3 IMPLANT
NS IRRIG 1000ML POUR BTL (IV SOLUTION) ×3 IMPLANT
PACK SHOULDER (CUSTOM PROCEDURE TRAY) ×3 IMPLANT
PAD ARMBOARD 7.5X6 YLW CONV (MISCELLANEOUS) ×6 IMPLANT
PLATE 9H LEFT CURVED PROX HUM (Plate) ×2 IMPLANT
SCREW CORTEX 3.5X26 (Screw) ×4 IMPLANT
SCREW LOCK 28X3.5XSTSTRL (Screw) IMPLANT
SCREW LOCK 3.5X44MM (Screw) ×2 IMPLANT
SCREW LOCK 32X3.5XSTSTRL (Screw) IMPLANT
SCREW LOCK ST EVOS 3.5 X 26 (Screw) ×4 IMPLANT
SCREW LOCK ST EVOS 3.5X38 (Screw) ×2 IMPLANT
SCREW LOCK ST EVOS 3.5X40 (Screw) ×2 IMPLANT
SCREW LOCK ST EVOS 3.5X42 (Screw) ×4 IMPLANT
SCREW LOCKING 3.5X28 (Screw) ×4 IMPLANT
SCREW LOCKING 3.5X32 (Screw) ×2 IMPLANT
SPONGE LAP 18X18 RF (DISPOSABLE) ×3 IMPLANT
STAPLER VISISTAT 35W (STAPLE) ×3 IMPLANT
SUCTION FRAZIER HANDLE 10FR (MISCELLANEOUS) ×2
SUCTION TUBE FRAZIER 10FR DISP (MISCELLANEOUS) ×1 IMPLANT
SUT ETHILON 3 0 FSL (SUTURE) IMPLANT
SUT FIBERWIRE #2 38 T-5 BLUE (SUTURE) ×12
SUT MNCRL AB 3-0 PS2 18 (SUTURE) ×3 IMPLANT
SUT MON AB 3-0 SH 27 (SUTURE) ×2
SUT MON AB 3-0 SH27 (SUTURE) IMPLANT
SUT VIC AB 0 CT1 27 (SUTURE) ×6
SUT VIC AB 0 CT1 27XBRD ANBCTR (SUTURE) ×2 IMPLANT
SUT VIC AB 2-0 CT1 27 (SUTURE) ×6
SUT VIC AB 2-0 CT1 TAPERPNT 27 (SUTURE) ×2 IMPLANT
SUTURE FIBERWR #2 38 T-5 BLUE (SUTURE) ×2 IMPLANT
TOWEL GREEN STERILE (TOWEL DISPOSABLE) ×6 IMPLANT
TRAY FOLEY MTR SLVR 16FR STAT (SET/KITS/TRAYS/PACK) IMPLANT
WATER STERILE IRR 1000ML POUR (IV SOLUTION) ×3 IMPLANT

## 2019-11-09 NOTE — Transfer of Care (Signed)
Immediate Anesthesia Transfer of Care Note  Patient: Charlene Woods  Procedure(s) Performed: OPEN REDUCTION INTERNAL FIXATION (ORIF) PROXIMAL HUMERUS FRACTURE (Left )  Patient Location: PACU  Anesthesia Type:General  Level of Consciousness: awake, alert  and oriented  Airway & Oxygen Therapy: Patient Spontanous Breathing and Patient connected to face mask oxygen  Post-op Assessment: Report given to RN and Post -op Vital signs reviewed and stable  Post vital signs: Reviewed and stable  Last Vitals:  Vitals Value Taken Time  BP 127/86   Temp    Pulse 73   Resp 16   SpO2 100%     Last Pain:  Vitals:   11/09/19 0929  TempSrc:   PainSc: 9       Patients Stated Pain Goal: 2 (11/09/19 0929)  Complications: No complications documented.

## 2019-11-09 NOTE — Progress Notes (Signed)
Handoff completed at 1142

## 2019-11-09 NOTE — Anesthesia Procedure Notes (Addendum)
Anesthesia Regional Block: Interscalene brachial plexus block   Pre-Anesthetic Checklist: ,, timeout performed, Correct Patient, Correct Site, Correct Laterality, Correct Procedure, Correct Position, site marked, Risks and benefits discussed,  Surgical consent,  Pre-op evaluation,  At surgeon's request and post-op pain management  Laterality: Upper and Left  Prep: Maximum Sterile Barrier Precautions used, chloraprep       Needles:  Injection technique: Single-shot  Needle Type: Echogenic Needle     Needle Length: 5cm  Needle Gauge: 21     Additional Needles:   Procedures:,,,, ultrasound used (permanent image in chart),,,,  Narrative:  Start time: 11/09/2019 9:49 AM End time: 11/09/2019 9:56 AM Injection made incrementally with aspirations every 5 mL.  Performed by: Personally  Anesthesiologist: Trevor Iha, MD  Additional Notes: Block assessed prior to procedure. Patient tolerated procedure well.

## 2019-11-09 NOTE — Interval H&P Note (Signed)
History and Physical Interval Note:  11/09/2019 9:33 AM  Charlene Woods  has presented today for surgery, with the diagnosis of Left proximal humerus fracture.  The various methods of treatment have been discussed with the patient and family. After consideration of risks, benefits and other options for treatment, the patient has consented to  Procedure(s): OPEN REDUCTION INTERNAL FIXATION (ORIF) PROXIMAL HUMERUS FRACTURE (Left) as a surgical intervention.  The patient's history has been reviewed, patient examined, no change in status, stable for surgery.  I have reviewed the patient's chart and labs.  Questions were answered to the patient's satisfaction.     Caryn Bee P Savilla Turbyfill
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
## 2019-11-09 NOTE — TOC Progression Note (Signed)
Transition of Care [facility]) - Progression Note    Patient Details  Name: Charlene Woods MRN: [id number] Date of Birth: August 07, 1970  Transition of [facility] CM/SW Contact  Oren Section Cleta Alberts, RN Phone Number: 11/09/2019, 5:12 PM  Clinical Narrative:   Met with pt to discuss possible SNF at discharge.  She states she has arranged to stay with a friend at discharge while she is recovering, and does not want to go to a facility.  She states that her husband will stay with her and provide 24h assistance.  Will continue to follow progress.      Expected Discharge Plan: [place] Barriers to Discharge: Continued Medical Work up  Expected Discharge Plan and Services Expected Discharge Plan: [place]       Living arrangements for the past 2 months: No permanent address                                       Social Determinants of Health (SDOH) Interventions    Readmission Risk Interventions No flowsheet data found.  Reinaldo Raddle, RN, BSN  Trauma/Neuro ICU Case Manager [phone]

## 2019-11-09 NOTE — Anesthesia Procedure Notes (Signed)
Procedure Name: Intubation Date/Time: 11/09/2019 10:43 AM Performed by: Bryson Corona, CRNA Pre-anesthesia Checklist: Patient identified, Emergency Drugs available, Suction available and Patient being monitored Patient Re-evaluated:Patient Re-evaluated prior to induction Oxygen Delivery Method: Circle System Utilized Preoxygenation: Pre-oxygenation with 100% oxygen Induction Type: IV induction Ventilation: Mask ventilation without difficulty Laryngoscope Size: Mac and 3 Grade View: Grade I Tube type: Oral Number of attempts: 1 Airway Equipment and Method: Stylet Placement Confirmation: ETT inserted through vocal cords under direct vision,  positive ETCO2 and breath sounds checked- equal and bilateral Secured at: 22 cm Tube secured with: Tape Dental Injury: Teeth and Oropharynx as per pre-operative assessment

## 2019-11-09 NOTE — Anesthesia Postprocedure Evaluation (Signed)
Anesthesia Post Note  Patient: Charlene Woods  Procedure(s) Performed: OPEN REDUCTION INTERNAL FIXATION (ORIF) PROXIMAL HUMERUS FRACTURE (Left )     Patient location during evaluation: PACU Anesthesia Type: General and Regional Level of consciousness: awake and alert Pain management: pain level controlled Vital Signs Assessment: post-procedure vital signs reviewed and stable Respiratory status: spontaneous breathing, nonlabored ventilation, respiratory function stable and patient connected to nasal cannula oxygen Cardiovascular status: blood pressure returned to baseline and stable Postop Assessment: no apparent nausea or vomiting Anesthetic complications: no   No complications documented.  Last Vitals:  Vitals:   11/09/19 1343 11/09/19 1627  BP: 101/70 106/74  Pulse: 61 79  Resp: 15 18  Temp: 36.8 C 36.8 C  SpO2: 92% 98%    Last Pain:  Vitals:   11/09/19 1627  TempSrc: Oral  PainSc:                  Trevor Iha
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
## 2019-11-10 LAB — CBC
HCT: 24.7 % — ABNORMAL LOW (ref 36.0–46.0)
Hemoglobin: 8.1 g/dL — ABNORMAL LOW (ref 12.0–15.0)
MCH: 35.1 pg — ABNORMAL HIGH (ref 26.0–34.0)
MCHC: 32.8 g/dL (ref 30.0–36.0)
MCV: 106.9 fL — ABNORMAL HIGH (ref 80.0–100.0)
Platelets: 265 10*3/uL (ref 150–400)
RBC: 2.31 MIL/uL — ABNORMAL LOW (ref 3.87–5.11)
RDW: 12.2 % (ref 11.5–15.5)
WBC: 8.4 10*3/uL (ref 4.0–10.5)
nRBC: 0 % (ref 0.0–0.2)

## 2019-11-10 LAB — BASIC METABOLIC PANEL
Anion gap: 9 (ref 5–15)
BUN: 7 mg/dL (ref 6–20)
CO2: 24 mmol/L (ref 22–32)
Calcium: 8.4 mg/dL — ABNORMAL LOW (ref 8.9–10.3)
Chloride: 105 mmol/L (ref 98–111)
Creatinine, Ser: 0.7 mg/dL (ref 0.44–1.00)
GFR calc Af Amer: >60 mL/min (ref >60)
GFR calc non Af Amer: >60 mL/min (ref >60)
Glucose, Bld: 117 mg/dL — ABNORMAL HIGH (ref 70–99)
Potassium: 3.2 mmol/L — ABNORMAL LOW (ref 3.5–5.1)
Sodium: 138 mmol/L (ref 135–145)

## 2019-11-10 LAB — VITAMIN D 25 HYDROXY (VIT D DEFICIENCY, FRACTURES): Vit D, 25-Hydroxy: 40.42 ng/mL (ref 30–100)

## 2019-11-10 MED ORDER — ACETAMINOPHEN 500 MG PO TABS
1000.0000 mg | ORAL_TABLET | Freq: Three times a day (TID) | ORAL | Status: DC
  Administered 2019-11-10 – 2019-11-11 (×4): 1000 mg via ORAL
  Filled 2019-11-10 (×4): qty 2
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
## 2019-11-10 NOTE — TOC CAGE-AID Note (Signed)
Transition of Care [facility]) - CAGE-AID Screening   Patient Details  Name: Charlene Woods MRN: [id number] Date of Birth: [birth year]  [facility] CM/SW Contact:    Emeterio Reeve, [place] Phone Number: 11/10/2019, 1:54 PM   Clinical Narrative:  CSW met with pt at bedside. CSW introduced self and explained her role at the hospital.  Pt reports daily alcohol use. Pt reports that she drinks about 15 beers a day. Pt reports she starts drinking beers around lunch and continues until she goes to bed. Pt reports occasional substance use. When CSW asked what substances used pt reported "this or that".   CSW discussed the effects of alcohol use and substance use on the body. Pt reports that she knows she should stop but stated she will try to cut down on the amount of beers she drinks per day. Pt reports her husband and her friend dont drink  and they will be there to support her. Pt declined resources for treatment. Pt stated she will try to stop on her own first. Pt was receptive to educational handout.   CAGE-AID Screening:    Have You Ever Felt You Ought to Cut Down on Your Drinking or Drug Use?: Yes Have People Annoyed You By Critizing Your Drinking Or Drug Use?: No Have You Felt Bad Or Guilty About Your Drinking Or Drug Use?: No Have You Ever Had a Drink or Used Drugs First Thing In The Morning to Steady Your Nerves or to Get Rid of a Hangover?: No CAGE-AID Score: 1  Substance Abuse Education Offered: Yes  Substance abuse interventions: Patient Counseling, Educational Materials  Emeterio Reeve, Latanya Presser, [place] Social Worker [phone]
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
## 2019-11-11 ENCOUNTER — Encounter (HOSPITAL_COMMUNITY): Payer: Self-pay | Admitting: Student

## 2019-11-11 LAB — CBC
HCT: 28.3 % — ABNORMAL LOW (ref 36.0–46.0)
Hemoglobin: 9.1 g/dL — ABNORMAL LOW (ref 12.0–15.0)
MCH: 34.9 pg — ABNORMAL HIGH (ref 26.0–34.0)
MCHC: 32.2 g/dL (ref 30.0–36.0)
MCV: 108.4 fL — ABNORMAL HIGH (ref 80.0–100.0)
Platelets: 320 10*3/uL (ref 150–400)
RBC: 2.61 MIL/uL — ABNORMAL LOW (ref 3.87–5.11)
RDW: 12.4 % (ref 11.5–15.5)
WBC: 8.1 10*3/uL (ref 4.0–10.5)
nRBC: 0 % (ref 0.0–0.2)

## 2019-11-11 MED ORDER — POTASSIUM CHLORIDE CRYS ER 20 MEQ PO TBCR
40.0000 meq | EXTENDED_RELEASE_TABLET | Freq: Two times a day (BID) | ORAL | Status: DC
  Administered 2019-11-11: 40 meq via ORAL
  Filled 2019-11-11: qty 2

## 2019-11-11 MED ORDER — CYCLOBENZAPRINE HCL 10 MG PO TABS: 10.0000 mg | ORAL_TABLET | Freq: Three times a day (TID) | ORAL | 0 refills | Status: DC | PRN

## 2019-11-11 MED ORDER — OXYCODONE HCL 10 MG PO TABS: 5.0000 mg | ORAL_TABLET | Freq: Four times a day (QID) | ORAL | 0 refills | Status: DC | PRN

## 2019-11-11 MED ORDER — ACETAMINOPHEN 500 MG PO TABS: 1000.0000 mg | ORAL_TABLET | Freq: Three times a day (TID) | ORAL | 0 refills | Status: DC | PRN

## 2019-11-11 MED ORDER — POLYETHYLENE GLYCOL 3350 17 G PO PACK: 17.0000 g | PACK | Freq: Every day | ORAL | 0 refills | Status: DC | PRN
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
## 2019-11-11 NOTE — Discharge Instructions (Addendum)
Orthopaedic Trauma Service Discharge Instructions   General Discharge Instructions  WEIGHT BEARING STATUS: Non-weightbearing left arm  RANGE OF MOTION/ACTIVITY: Okay for shoulder and elbow motion as tolerated  Wound Care: Incisions can be left open to air if there is no drainage. If incision continues to have drainage, follow wound care instructions below. Okay to shower if no drainage from incisions.  DVT/PE prophylaxis: Lovenox  Diet: as you were eating previously.  Can use over the counter stool softeners and bowel preparations, such as Miralax, to help with bowel movements.  Narcotics can be constipating.  Be sure to drink plenty of fluids  PAIN MEDICATION USE AND EXPECTATIONS  You have likely been given narcotic medications to help control your pain.  After a traumatic event that results in an fracture (broken bone) with or without surgery, it is ok to use narcotic pain medications to help control one's pain.  We understand that everyone responds to pain differently and each individual patient will be evaluated on a regular basis for the continued need for narcotic medications. Ideally, narcotic medication use should last no more than 6-8 weeks (coinciding with fracture healing).   As a patient it is your responsibility as well to monitor narcotic medication use and report the amount and frequency you use these medications when you come to your office visit.   We would also advise that if you are using narcotic medications, you should take a dose prior to therapy to maximize you participation.  IF YOU ARE ON NARCOTIC MEDICATIONS IT IS NOT PERMISSIBLE TO OPERATE A MOTOR VEHICLE (MOTORCYCLE/CAR/TRUCK/MOPED) OR HEAVY MACHINERY DO NOT MIX NARCOTICS WITH OTHER CNS (CENTRAL NERVOUS SYSTEM) DEPRESSANTS SUCH AS ALCOHOL   STOP SMOKING OR USING NICOTINE PRODUCTS!!!!  As discussed nicotine severely impairs your body's ability to heal surgical and traumatic wounds but also impairs bone healing.   Wounds and bone heal by forming microscopic blood vessels (angiogenesis) and nicotine is a vasoconstrictor (essentially, shrinks blood vessels).  Therefore, if vasoconstriction occurs to these microscopic blood vessels they essentially disappear and are unable to deliver necessary nutrients to the healing tissue.  This is one modifiable factor that you can do to dramatically increase your chances of healing your injury.    (This means no smoking, no nicotine gum, patches, etc)  DO NOT USE NONSTEROIDAL ANTI-INFLAMMATORY DRUGS (NSAID'S)  Using products such as Advil (ibuprofen), Aleve (naproxen), Motrin (ibuprofen) for additional pain control during fracture healing can delay and/or prevent the healing response.  If you would like to take over the counter (OTC) medication, Tylenol (acetaminophen) is ok.  However, some narcotic medications that are given for pain control contain acetaminophen as well. Therefore, you should not exceed more than 4000 mg of tylenol in a day if you do not have liver disease.  Also note that there are may OTC medicines, such as cold medicines and allergy medicines that my contain tylenol as well.  If you have any questions about medications and/or interactions please ask your doctor/PA or your pharmacist.      ICE AND ELEVATE INJURED/OPERATIVE EXTREMITY  Using ice and elevating the injured extremity above your heart can help with swelling and pain control.  Icing in a pulsatile fashion, such as 20 minutes on and 20 minutes off, can be followed.    Do not place ice directly on skin. Make sure there is a barrier between to skin and the ice pack.    Using frozen items such as frozen peas works well as the  conform nicely to the are that needs to be iced.  USE AN ACE WRAP OR TED HOSE FOR SWELLING CONTROL  In addition to icing and elevation, Ace wraps or TED hose are used to help limit and resolve swelling.  It is recommended to use Ace wraps or TED hose until you are informed to  stop.    When using Ace Wraps start the wrapping distally (farthest away from the body) and wrap proximally (closer to the body)   Example: If you had surgery on your leg or thing and you do not have a splint on, start the ace wrap at the toes and work your way up to the thigh        If you had surgery on your upper extremity and do not have a splint on, start the ace wrap at your fingers and work your way up to the upper arm   CALL THE OFFICE WITH ANY QUESTIONS OR CONCERNS: [phone]   VISIT OUR WEBSITE FOR ADDITIONAL INFORMATION: [email or web address]     Discharge Wound Care Instructions  Do NOT apply any ointments, solutions or lotions to pin sites or surgical wounds.  These prevent needed drainage and even though solutions like hydrogen peroxide kill bacteria, they also damage cells lining the pin sites that help fight infection.  Applying lotions or ointments can keep the wounds moist and can cause them to breakdown and open up as well. This can increase the risk for infection. When in doubt call the office.  Surgical incisions should be dressed daily.  If any drainage is noted, use one layer of adaptic, then gauze, Kerlix, and an ace wrap.  Once the incision is completely dry and without drainage, it may be left open to air out.  Showering may begin 36-48 hours later.  Cleaning gently with soap and water.  Traumatic wounds should be dressed daily as well.    One layer of adaptic, gauze, Kerlix, then ace wrap.  The adaptic can be discontinued once the draining has ceased    If you have a wet to dry dressing: wet the gauze with saline the squeeze as much saline out so the gauze is moist (not soaking wet), place moistened gauze over wound, then place a dry gauze over the moist one, followed by Kerlix wrap, then ace wrap.

## 2019-11-11 NOTE — TOC Transition Note (Addendum)
Transition of Care [facility]) - CM/SW Discharge Note   Patient Details  Name: Adasha Boehme MRN: [id number] Date of Birth: 11-11-1970  [facility] of [facility] CM/SW Contact:  Doy Hutching, LCSW Phone Number:  11/11/2019, 11:58 AM   Clinical Narrative:    3:27pm- Pt now states her friend will pick her up bedside RN aware and will contact TOC team if they do end up needing cab voucher.   11:58am- CSW received a message from bedside RN that pt needing clothing to d/c, clothing obtained from clothes closet. CSW also messaged about a cab voucher. Will provide assistance once I have address to complete voucher.    Final next level of care: Home/Self Care Barriers to Discharge: Barriers Resolved   Patient Goals and CMS Choice  Pt declines SNF and unable secure HH due to Medicaid only status.  Discharge Placement Name of family member notified: pt responsible for self. Patient and family notified of of transfer: 11/11/19  Discharge Plan and Services      DME Arranged: Dan Humphreys rolling with seat DME Agency: [employer] Date DME Agency Contacted: 11/11/19 Representative spoke with at DME Agency: Velna Hatchet  Readmission Risk Interventions No flowsheet data found.

## 2019-11-14 ENCOUNTER — Encounter: Payer: Self-pay | Admitting: Internal Medicine

## 2019-11-22 ENCOUNTER — Ambulatory Visit: Payer: Medicaid Other | Admitting: Physical Therapy

## 2019-11-24 ENCOUNTER — Ambulatory Visit: Payer: Medicaid Other | Attending: Student | Admitting: Physical Therapy

## 2019-12-13 ENCOUNTER — Ambulatory Visit: Payer: Medicaid Other

## 2019-12-15 ENCOUNTER — Ambulatory Visit: Payer: Medicaid Other

## 2019-12-15 ENCOUNTER — Ambulatory Visit: Payer: Medicaid Other | Attending: Student | Admitting: Physical Therapy

## 2019-12-15 ENCOUNTER — Other Ambulatory Visit: Payer: Self-pay

## 2019-12-15 ENCOUNTER — Encounter: Payer: Self-pay | Admitting: Physical Therapy

## 2019-12-15 DIAGNOSIS — Z9889 Other specified postprocedural states: Secondary | ICD-10-CM | POA: Diagnosis present

## 2019-12-15 DIAGNOSIS — M25512 Pain in left shoulder: Secondary | ICD-10-CM | POA: Diagnosis present

## 2019-12-15 DIAGNOSIS — R293 Abnormal posture: Secondary | ICD-10-CM | POA: Diagnosis present

## 2019-12-15 DIAGNOSIS — Z8781 Personal history of (healed) traumatic fracture: Secondary | ICD-10-CM | POA: Insufficient documentation

## 2019-12-15 DIAGNOSIS — M6281 Muscle weakness (generalized): Secondary | ICD-10-CM | POA: Diagnosis present
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
## 2019-12-23 ENCOUNTER — Ambulatory Visit: Payer: Medicaid Other | Admitting: Physical Therapy

## 2020-01-10 ENCOUNTER — Telehealth: Payer: Self-pay | Admitting: Physical Therapy

## 2020-01-10 ENCOUNTER — Ambulatory Visit: Payer: Medicaid Other | Attending: Student | Admitting: Physical Therapy

## 2020-01-10 NOTE — Telephone Encounter (Signed)
Attempted to call patient regarding no show for 11:45 therapy appointment this AM. Phone number had automated message "not receiving calls" at number in chart so unable to reach by phone or leave voicemail regarding missed appointment.

## 2020-04-23 ENCOUNTER — Encounter (HOSPITAL_BASED_OUTPATIENT_CLINIC_OR_DEPARTMENT_OTHER): Payer: Self-pay

## 2020-04-23 ENCOUNTER — Emergency Department (HOSPITAL_BASED_OUTPATIENT_CLINIC_OR_DEPARTMENT_OTHER)
Admission: EM | Admit: 2020-04-23 | Discharge: 2020-04-23 | Disposition: A | Discharge status: Home / Self Care | Payer: Medicaid Other | Attending: Emergency Medicine | Admitting: Emergency Medicine

## 2020-04-23 ENCOUNTER — Other Ambulatory Visit: Payer: Self-pay

## 2020-04-23 DIAGNOSIS — M545 Low back pain, unspecified: Secondary | ICD-10-CM | POA: Insufficient documentation

## 2020-04-23 DIAGNOSIS — Z5902 Unsheltered homelessness: Secondary | ICD-10-CM | POA: Diagnosis not present

## 2020-04-23 DIAGNOSIS — Z5321 Procedure and treatment not carried out due to patient leaving prior to being seen by health care provider: Secondary | ICD-10-CM | POA: Diagnosis not present

## 2020-04-23 DIAGNOSIS — M79604 Pain in right leg: Secondary | ICD-10-CM | POA: Diagnosis not present

## 2020-04-23 NOTE — ED Triage Notes (Signed)
Pt arrives complaining of right leg and lower back pain. States has had pain x 1 week, takes advil with some relief. Pt is homeless, stays on streets in [place]. Went out to smoke cigarette prior to triage after arriving via EMS. Had 5 beers pta.

## 2020-04-23 NOTE — ED Notes (Signed)
Called Pt and pt was not in lobby was told that pt was outside smoking a cigarette
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
## 2021-03-26 ENCOUNTER — Encounter (HOSPITAL_COMMUNITY): Payer: Self-pay | Admitting: Emergency Medicine

## 2021-03-26 ENCOUNTER — Emergency Department (HOSPITAL_COMMUNITY)
Admission: EM | Admit: 2021-03-26 | Discharge: 2021-03-27 | Disposition: A | Discharge status: Home / Self Care | Payer: Medicaid Other | Attending: Student | Admitting: Student

## 2021-03-26 DIAGNOSIS — F10129 Alcohol abuse with intoxication, unspecified: Secondary | ICD-10-CM | POA: Insufficient documentation

## 2021-03-26 DIAGNOSIS — R112 Nausea with vomiting, unspecified: Secondary | ICD-10-CM | POA: Insufficient documentation

## 2021-03-26 DIAGNOSIS — Z5321 Procedure and treatment not carried out due to patient leaving prior to being seen by health care provider: Secondary | ICD-10-CM | POA: Diagnosis not present

## 2021-03-26 LAB — CBC WITH DIFFERENTIAL/PLATELET
Abs Immature Granulocytes: 0.09 10*3/uL — ABNORMAL HIGH (ref 0.00–0.07)
Basophils Absolute: 0.1 10*3/uL (ref 0.0–0.1)
Basophils Relative: 1 %
Eosinophils Absolute: 0 10*3/uL (ref 0.0–0.5)
Eosinophils Relative: 0 %
HCT: 46.2 % — ABNORMAL HIGH (ref 36.0–46.0)
Hemoglobin: 15.3 g/dL — ABNORMAL HIGH (ref 12.0–15.0)
Immature Granulocytes: 1 %
Lymphocytes Relative: 13 %
Lymphs Abs: 1.3 10*3/uL (ref 0.7–4.0)
MCH: 35.1 pg — ABNORMAL HIGH (ref 26.0–34.0)
MCHC: 33.1 g/dL (ref 30.0–36.0)
MCV: 106 fL — ABNORMAL HIGH (ref 80.0–100.0)
Monocytes Absolute: 0.6 10*3/uL (ref 0.1–1.0)
Monocytes Relative: 6 %
Neutro Abs: 8 10*3/uL — ABNORMAL HIGH (ref 1.7–7.7)
Neutrophils Relative %: 79 %
Platelets: 255 10*3/uL (ref 150–400)
RBC: 4.36 MIL/uL (ref 3.87–5.11)
RDW: 11.4 % — ABNORMAL LOW (ref 11.5–15.5)
WBC: 10.1 10*3/uL (ref 4.0–10.5)
nRBC: 0 % (ref 0.0–0.2)

## 2021-03-26 LAB — COMPREHENSIVE METABOLIC PANEL
ALT: 22 U/L (ref 0–44)
AST: 26 U/L (ref 15–41)
Albumin: 4 g/dL (ref 3.5–5.0)
Alkaline Phosphatase: 57 U/L (ref 38–126)
Anion gap: 12 (ref 5–15)
BUN: 11 mg/dL (ref 6–20)
CO2: 24 mmol/L (ref 22–32)
Calcium: 9.3 mg/dL (ref 8.9–10.3)
Chloride: 105 mmol/L (ref 98–111)
Creatinine, Ser: 0.84 mg/dL (ref 0.44–1.00)
GFR, Estimated: >60 mL/min (ref >60)
Glucose, Bld: 125 mg/dL — ABNORMAL HIGH (ref 70–99)
Potassium: 4.1 mmol/L (ref 3.5–5.1)
Sodium: 141 mmol/L (ref 135–145)
Total Bilirubin: 0.6 mg/dL (ref 0.3–1.2)
Total Protein: 7 g/dL (ref 6.5–8.1)

## 2021-03-26 LAB — ETHANOL: Alcohol, Ethyl (B): 29 mg/dL — ABNORMAL HIGH (ref <10)

## 2021-03-26 LAB — LIPASE, BLOOD: Lipase: 27 U/L (ref 11–51)

## 2021-03-26 MED ORDER — ONDANSETRON 4 MG PO TBDP
4.0000 mg | ORAL_TABLET | Freq: Once | ORAL | Status: AC
  Administered 2021-03-26: 23:00:00 4 mg via ORAL
  Filled 2021-03-26: qty 1

## 2021-03-26 NOTE — ED Provider Triage Note (Signed)
Emergency Medicine Provider Triage Evaluation Note  Charlene Woods , a 51 y.o. female  was evaluated in triage.  Pt complains of nausea and vomiting. Drank 2 beers today. Typically drinks 5-6 beers daily. Patient brought in by EMS due to alcohol intoxication.  Review of Systems  Positive: emesis Negative: fever  Physical Exam  BP (!) 133/103 (BP Location: Right Arm)    Pulse (!) 111    Temp 97.8 F (36.6 C) (Oral)    Resp 20    LMP  (LMP Unknown) Comment: Level 1 trauma   SpO2 95%  Gen:   Awake, no distress   Resp:  Normal effort  MSK:   Moves extremities without difficulty  Other:    Medical Decision Making  Medically screening exam initiated at 9:43 PM.  Appropriate orders placed.  Carra Brindley was informed that the remainder of the evaluation will be completed by another provider, this initial triage assessment does not replace that evaluation, and the importance of remaining in the ED until their evaluation is complete.  Labs Zofran given in triage   Jesusita Oka 03/26/21 2144
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
## 2021-03-27 NOTE — ED Notes (Signed)
Patient stated she didn't want to wait any longer. ?

## 2021-05-28 ENCOUNTER — Ambulatory Visit (HOSPITAL_COMMUNITY): Payer: Self-pay

## 2021-05-29 ENCOUNTER — Ambulatory Visit (INDEPENDENT_AMBULATORY_CARE_PROVIDER_SITE_OTHER): Payer: Medicaid Other

## 2021-05-29 ENCOUNTER — Encounter (HOSPITAL_COMMUNITY): Payer: Self-pay

## 2021-05-29 ENCOUNTER — Ambulatory Visit (HOSPITAL_COMMUNITY)
Admission: RE | Admit: 2021-05-29 | Discharge: 2021-05-29 | Disposition: A | Discharge status: Home / Self Care | Payer: Medicaid Other | Source: Ambulatory Visit | Attending: Nurse Practitioner | Admitting: Nurse Practitioner

## 2021-05-29 ENCOUNTER — Ambulatory Visit (HOSPITAL_COMMUNITY): Payer: Medicaid Other

## 2021-05-29 VITALS — BP 109/76 | HR 79 | Temp 98.2°F | Resp 18

## 2021-05-29 DIAGNOSIS — M25561 Pain in right knee: Secondary | ICD-10-CM

## 2021-05-29 DIAGNOSIS — S82031A Displaced transverse fracture of right patella, initial encounter for closed fracture: Secondary | ICD-10-CM

## 2021-05-29 MED ORDER — OXYCODONE-ACETAMINOPHEN 5-325 MG PO TABS
1.0000 | ORAL_TABLET | Freq: Four times a day (QID) | ORAL | 0 refills | Status: AC | PRN
Start: 2021-05-29 — End: 2021-06-03
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
## 2021-05-29 NOTE — Discharge Instructions (Signed)
-   Please wear the knee immobilizer and try not to put weight on your right leg  ?- You can use the pain medication every 6 hours as needed ?- Please follow up with [facility] tomorrow morning - the number and address is provided below. ?

## 2023-05-16 ENCOUNTER — Inpatient Hospital Stay (HOSPITAL_COMMUNITY)
Admission: EM | Admit: 2023-05-16 | Discharge: 2023-05-19 | DRG: 896 | Disposition: A | Discharge status: Home / Self Care | Attending: Family Medicine | Admitting: Family Medicine

## 2023-05-16 ENCOUNTER — Emergency Department (HOSPITAL_COMMUNITY)

## 2023-05-16 ENCOUNTER — Other Ambulatory Visit: Payer: Self-pay

## 2023-05-16 ENCOUNTER — Encounter (HOSPITAL_COMMUNITY): Payer: Self-pay | Admitting: *Deleted

## 2023-05-16 ENCOUNTER — Inpatient Hospital Stay (HOSPITAL_COMMUNITY)

## 2023-05-16 DIAGNOSIS — R569 Unspecified convulsions: Secondary | ICD-10-CM | POA: Diagnosis present

## 2023-05-16 DIAGNOSIS — J4489 Other specified chronic obstructive pulmonary disease: Secondary | ICD-10-CM | POA: Diagnosis present

## 2023-05-16 DIAGNOSIS — Z884 Allergy status to anesthetic agent status: Secondary | ICD-10-CM

## 2023-05-16 DIAGNOSIS — Z833 Family history of diabetes mellitus: Secondary | ICD-10-CM

## 2023-05-16 DIAGNOSIS — J9601 Acute respiratory failure with hypoxia: Secondary | ICD-10-CM | POA: Diagnosis present

## 2023-05-16 DIAGNOSIS — Z91030 Bee allergy status: Secondary | ICD-10-CM | POA: Diagnosis not present

## 2023-05-16 DIAGNOSIS — E875 Hyperkalemia: Secondary | ICD-10-CM | POA: Diagnosis present

## 2023-05-16 DIAGNOSIS — S0081XA Abrasion of other part of head, initial encounter: Secondary | ICD-10-CM | POA: Diagnosis present

## 2023-05-16 DIAGNOSIS — Z5901 Sheltered homelessness: Secondary | ICD-10-CM

## 2023-05-16 DIAGNOSIS — F10239 Alcohol dependence with withdrawal, unspecified: Principal | ICD-10-CM | POA: Diagnosis present

## 2023-05-16 DIAGNOSIS — Z885 Allergy status to narcotic agent status: Secondary | ICD-10-CM

## 2023-05-16 DIAGNOSIS — Z91013 Allergy to seafood: Secondary | ICD-10-CM | POA: Diagnosis not present

## 2023-05-16 DIAGNOSIS — J189 Pneumonia, unspecified organism: Secondary | ICD-10-CM | POA: Diagnosis not present

## 2023-05-16 DIAGNOSIS — E872 Acidosis, unspecified: Secondary | ICD-10-CM | POA: Diagnosis not present

## 2023-05-16 DIAGNOSIS — Z79899 Other long term (current) drug therapy: Secondary | ICD-10-CM

## 2023-05-16 DIAGNOSIS — J690 Pneumonitis due to inhalation of food and vomit: Secondary | ICD-10-CM | POA: Diagnosis present

## 2023-05-16 DIAGNOSIS — F1423 Cocaine dependence with withdrawal: Secondary | ICD-10-CM | POA: Diagnosis present

## 2023-05-16 DIAGNOSIS — R4182 Altered mental status, unspecified: Secondary | ICD-10-CM | POA: Diagnosis present

## 2023-05-16 DIAGNOSIS — Z880 Allergy status to penicillin: Secondary | ICD-10-CM

## 2023-05-16 DIAGNOSIS — Z5902 Unsheltered homelessness: Secondary | ICD-10-CM | POA: Diagnosis not present

## 2023-05-16 DIAGNOSIS — F17210 Nicotine dependence, cigarettes, uncomplicated: Secondary | ICD-10-CM | POA: Diagnosis present

## 2023-05-16 DIAGNOSIS — Y908 Blood alcohol level of 240 mg/100 ml or more: Secondary | ICD-10-CM | POA: Diagnosis present

## 2023-05-16 DIAGNOSIS — E8729 Other acidosis: Secondary | ICD-10-CM | POA: Diagnosis not present

## 2023-05-16 DIAGNOSIS — Z23 Encounter for immunization: Secondary | ICD-10-CM | POA: Diagnosis not present

## 2023-05-16 DIAGNOSIS — F1490 Cocaine use, unspecified, uncomplicated: Secondary | ICD-10-CM | POA: Diagnosis not present

## 2023-05-16 DIAGNOSIS — Z886 Allergy status to analgesic agent status: Secondary | ICD-10-CM | POA: Diagnosis not present

## 2023-05-16 DIAGNOSIS — Z803 Family history of malignant neoplasm of breast: Secondary | ICD-10-CM

## 2023-05-16 DIAGNOSIS — R531 Weakness: Secondary | ICD-10-CM

## 2023-05-16 DIAGNOSIS — Z781 Physical restraint status: Secondary | ICD-10-CM

## 2023-05-16 DIAGNOSIS — R402431 Glasgow coma scale score 3-8, in the field [EMT or ambulance]: Principal | ICD-10-CM

## 2023-05-16 DIAGNOSIS — F1090 Alcohol use, unspecified, uncomplicated: Secondary | ICD-10-CM | POA: Diagnosis not present

## 2023-05-16 LAB — CBC WITH DIFFERENTIAL/PLATELET
Abs Immature Granulocytes: 0.04 10*3/uL (ref 0.00–0.07)
Basophils Absolute: 0.1 10*3/uL (ref 0.0–0.1)
Basophils Relative: 1 %
Eosinophils Absolute: 0.2 10*3/uL (ref 0.0–0.5)
Eosinophils Relative: 2 %
HCT: 52.6 % — ABNORMAL HIGH (ref 36.0–46.0)
Hemoglobin: 17.9 g/dL — ABNORMAL HIGH (ref 12.0–15.0)
Immature Granulocytes: 0 %
Lymphocytes Relative: 41 %
Lymphs Abs: 5.2 10*3/uL — ABNORMAL HIGH (ref 0.7–4.0)
MCH: 33 pg (ref 26.0–34.0)
MCHC: 34 g/dL (ref 30.0–36.0)
MCV: 96.9 fL (ref 80.0–100.0)
Monocytes Absolute: 1 10*3/uL (ref 0.1–1.0)
Monocytes Relative: 8 %
Neutro Abs: 6.2 10*3/uL (ref 1.7–7.7)
Neutrophils Relative %: 48 %
Platelets: 336 10*3/uL (ref 150–400)
RBC: 5.43 MIL/uL — ABNORMAL HIGH (ref 3.87–5.11)
RDW: 12.4 % (ref 11.5–15.5)
WBC: 12.8 10*3/uL — ABNORMAL HIGH (ref 4.0–10.5)
nRBC: 0 % (ref 0.0–0.2)

## 2023-05-16 LAB — I-STAT ARTERIAL BLOOD GAS, ED
Acid-base deficit: 7 mmol/L — ABNORMAL HIGH (ref 0.0–2.0)
Bicarbonate: 21 mmol/L (ref 20.0–28.0)
Calcium, Ion: 1.03 mmol/L — ABNORMAL LOW (ref 1.15–1.40)
HCT: 48 % — ABNORMAL HIGH (ref 36.0–46.0)
Hemoglobin: 16.3 g/dL — ABNORMAL HIGH (ref 12.0–15.0)
O2 Saturation: 100 %
Potassium: 3.7 mmol/L (ref 3.5–5.1)
Sodium: 140 mmol/L (ref 135–145)
TCO2: 22 mmol/L (ref 22–32)
pCO2 arterial: 48.2 mmHg — ABNORMAL HIGH (ref 32–48)
pH, Arterial: 7.248 — ABNORMAL LOW (ref 7.35–7.45)
pO2, Arterial: 508 mmHg — ABNORMAL HIGH (ref 83–108)

## 2023-05-16 LAB — COMPREHENSIVE METABOLIC PANEL
ALT: 14 U/L (ref 0–44)
AST: 23 U/L (ref 15–41)
Albumin: 3.7 g/dL (ref 3.5–5.0)
Alkaline Phosphatase: 61 U/L (ref 38–126)
Anion gap: 13 (ref 5–15)
BUN: 11 mg/dL (ref 6–20)
CO2: 18 mmol/L — ABNORMAL LOW (ref 22–32)
Calcium: 8.3 mg/dL — ABNORMAL LOW (ref 8.9–10.3)
Chloride: 106 mmol/L (ref 98–111)
Creatinine, Ser: 0.77 mg/dL (ref 0.44–1.00)
GFR, Estimated: >60 mL/min (ref >60)
Glucose, Bld: 119 mg/dL — ABNORMAL HIGH (ref 70–99)
Potassium: 3.7 mmol/L (ref 3.5–5.1)
Sodium: 137 mmol/L (ref 135–145)
Total Bilirubin: 0.6 mg/dL (ref 0.0–1.2)
Total Protein: 7 g/dL (ref 6.5–8.1)

## 2023-05-16 LAB — URINALYSIS, ROUTINE W REFLEX MICROSCOPIC
Bilirubin Urine: NEGATIVE
Bilirubin Urine: NEGATIVE
Glucose, UA: NEGATIVE mg/dL
Glucose, UA: NEGATIVE mg/dL
Hgb urine dipstick: NEGATIVE
Hgb urine dipstick: NEGATIVE
Ketones, ur: NEGATIVE mg/dL
Ketones, ur: NEGATIVE mg/dL
Leukocytes,Ua: NEGATIVE
Leukocytes,Ua: NEGATIVE
Nitrite: NEGATIVE
Nitrite: NEGATIVE
Protein, ur: NEGATIVE mg/dL
Protein, ur: NEGATIVE mg/dL
Specific Gravity, Urine: 1.005 (ref 1.005–1.030)
Specific Gravity, Urine: 1.006 (ref 1.005–1.030)
pH: 5 (ref 5.0–8.0)
pH: 5 (ref 5.0–8.0)

## 2023-05-16 LAB — I-STAT VENOUS BLOOD GAS, ED
Acid-base deficit: 6 mmol/L — ABNORMAL HIGH (ref 0.0–2.0)
Bicarbonate: 17.4 mmol/L — ABNORMAL LOW (ref 20.0–28.0)
Calcium, Ion: 0.81 mmol/L — CL (ref 1.15–1.40)
HCT: 53 % — ABNORMAL HIGH (ref 36.0–46.0)
Hemoglobin: 18 g/dL — ABNORMAL HIGH (ref 12.0–15.0)
O2 Saturation: 99 %
Potassium: 6.3 mmol/L (ref 3.5–5.1)
Sodium: 134 mmol/L — ABNORMAL LOW (ref 135–145)
TCO2: 18 mmol/L — ABNORMAL LOW (ref 22–32)
pCO2, Ven: 30.7 mmHg — ABNORMAL LOW (ref 44–60)
pH, Ven: 7.362 (ref 7.25–7.43)
pO2, Ven: 153 mmHg — ABNORMAL HIGH (ref 32–45)

## 2023-05-16 LAB — I-STAT CHEM 8, ED
BUN: 15 mg/dL (ref 6–20)
Calcium, Ion: 0.83 mmol/L — CL (ref 1.15–1.40)
Chloride: 108 mmol/L (ref 98–111)
Creatinine, Ser: 1 mg/dL (ref 0.44–1.00)
Glucose, Bld: 123 mg/dL — ABNORMAL HIGH (ref 70–99)
HCT: 51 % — ABNORMAL HIGH (ref 36.0–46.0)
Hemoglobin: 17.3 g/dL — ABNORMAL HIGH (ref 12.0–15.0)
Potassium: 6.3 mmol/L (ref 3.5–5.1)
Sodium: 134 mmol/L — ABNORMAL LOW (ref 135–145)
TCO2: 18 mmol/L — ABNORMAL LOW (ref 22–32)

## 2023-05-16 LAB — GLUCOSE, CAPILLARY: Glucose-Capillary: 127 mg/dL — ABNORMAL HIGH (ref 70–99)

## 2023-05-16 LAB — RAPID URINE DRUG SCREEN, HOSP PERFORMED
Amphetamines: NOT DETECTED
Barbiturates: NOT DETECTED
Benzodiazepines: POSITIVE — AB
Cocaine: POSITIVE — AB
Opiates: NOT DETECTED
Tetrahydrocannabinol: NOT DETECTED

## 2023-05-16 LAB — ETHANOL: Alcohol, Ethyl (B): 574 mg/dL (ref <10)

## 2023-05-16 LAB — I-STAT CG4 LACTIC ACID, ED: Lactic Acid, Venous: 4.2 mmol/L (ref 0.5–1.9)

## 2023-05-16 MED ORDER — NOREPINEPHRINE 4 MG/250ML-% IV SOLN
INTRAVENOUS | Status: AC
Start: 2023-05-16 — End: 2023-05-17
  Administered 2023-05-17: 2 ug/min via INTRAVENOUS
  Filled 2023-05-16: qty 250

## 2023-05-16 MED ORDER — DOCUSATE SODIUM 50 MG/5ML PO LIQD: 100.0000 mg | Freq: Two times a day (BID) | ORAL | Status: DC | PRN

## 2023-05-16 MED ORDER — HEPARIN SODIUM (PORCINE) 5000 UNIT/ML IJ SOLN
5000.0000 [IU] | Freq: Three times a day (TID) | INTRAMUSCULAR | Status: DC
  Administered 2023-05-17 – 2023-05-19 (×8): 5000 [IU] via SUBCUTANEOUS
  Filled 2023-05-16 (×8): qty 1

## 2023-05-16 MED ORDER — INSULIN ASPART 100 UNIT/ML IJ SOLN
0.0000 [IU] | INTRAMUSCULAR | Status: DC
  Administered 2023-05-17 – 2023-05-18 (×2): 2 [IU] via SUBCUTANEOUS
  Administered 2023-05-18: 3 [IU] via SUBCUTANEOUS
  Administered 2023-05-19: 2 [IU] via SUBCUTANEOUS

## 2023-05-16 MED ORDER — POLYETHYLENE GLYCOL 3350 17 G PO PACK: 17.0000 g | PACK | Freq: Every day | ORAL | Status: DC | PRN

## 2023-05-16 MED ORDER — FAMOTIDINE 20 MG PO TABS: 20.0000 mg | ORAL_TABLET | Freq: Two times a day (BID) | ORAL | Status: DC

## 2023-05-16 MED ORDER — LORAZEPAM 2 MG/ML IJ SOLN: 4.0000 mg | Freq: Once | INTRAMUSCULAR | Status: AC

## 2023-05-16 MED ORDER — LACTATED RINGERS IV BOLUS
1000.0000 mL | Freq: Once | INTRAVENOUS | Status: AC
Start: 2023-05-16 — End: 2023-05-16
  Administered 2023-05-16: 1000 mL via INTRAVENOUS

## 2023-05-16 MED ORDER — CHLORHEXIDINE GLUCONATE CLOTH 2 % EX PADS
6.0000 | MEDICATED_PAD | Freq: Every day | CUTANEOUS | Status: DC
  Administered 2023-05-16 – 2023-05-18 (×3): 6 via TOPICAL

## 2023-05-16 MED ORDER — DOCUSATE SODIUM 100 MG PO CAPS: 100.0000 mg | ORAL_CAPSULE | Freq: Two times a day (BID) | ORAL | Status: DC | PRN

## 2023-05-16 MED ORDER — SODIUM CHLORIDE 0.9 % IV SOLN
3000.0000 mg | Freq: Once | INTRAVENOUS | Status: AC
  Administered 2023-05-17: 3000 mg via INTRAVENOUS
  Filled 2023-05-16: qty 30

## 2023-05-16 MED ORDER — VANCOMYCIN HCL IN DEXTROSE 1-5 GM/200ML-% IV SOLN
1000.0000 mg | Freq: Once | INTRAVENOUS | Status: AC
  Administered 2023-05-16: 1000 mg via INTRAVENOUS
  Filled 2023-05-16: qty 200

## 2023-05-16 MED ORDER — IOHEXOL 350 MG/ML SOLN
75.0000 mL | Freq: Once | INTRAVENOUS | Status: AC | PRN
  Administered 2023-05-16: 75 mL via INTRAVENOUS

## 2023-05-16 MED ORDER — PROPOFOL 10 MG/ML IV BOLUS: 0.5000 mg/kg | Freq: Once | INTRAVENOUS | Status: DC

## 2023-05-16 MED ORDER — ONDANSETRON HCL 4 MG/2ML IJ SOLN
INTRAMUSCULAR | Status: AC
Start: 2023-05-16 — End: 2023-05-16
  Administered 2023-05-16: 4 mg
  Filled 2023-05-16: qty 2

## 2023-05-16 MED ORDER — ORAL CARE MOUTH RINSE: 15.0000 mL | OROMUCOSAL | Status: DC | PRN

## 2023-05-16 MED ORDER — SODIUM CHLORIDE 0.9 % IV BOLUS
1000.0000 mL | Freq: Once | INTRAVENOUS | Status: AC
  Administered 2023-05-16: 1000 mL via INTRAVENOUS

## 2023-05-16 MED ORDER — PROPOFOL 1000 MG/100ML IV EMUL
5.0000 ug/kg/min | INTRAVENOUS | Status: DC
  Administered 2023-05-16 – 2023-05-17 (×2): 5 ug/kg/min via INTRAVENOUS
  Filled 2023-05-16 (×2): qty 100

## 2023-05-16 MED ORDER — ONDANSETRON HCL 4 MG/2ML IJ SOLN: 4.0000 mg | Freq: Four times a day (QID) | INTRAMUSCULAR | Status: DC | PRN

## 2023-05-16 MED ORDER — SODIUM CHLORIDE 0.9 % IV SOLN
500.0000 mg | INTRAVENOUS | Status: DC
  Administered 2023-05-17: 500 mg via INTRAVENOUS
  Filled 2023-05-16 (×2): qty 5

## 2023-05-16 MED ORDER — SODIUM CHLORIDE 0.9 % IV SOLN
1.0000 g | Freq: Once | INTRAVENOUS | Status: AC
  Administered 2023-05-16: 1 g via INTRAVENOUS
  Filled 2023-05-16: qty 10

## 2023-05-16 MED ORDER — LORAZEPAM 2 MG/ML IJ SOLN
INTRAMUSCULAR | Status: AC
  Administered 2023-05-16: 4 mg via INTRAVENOUS
  Filled 2023-05-16: qty 2

## 2023-05-16 MED ORDER — SODIUM CHLORIDE 0.9 % IV SOLN
500.0000 mg | Freq: Once | INTRAVENOUS | Status: AC
  Administered 2023-05-16: 500 mg via INTRAVENOUS
  Filled 2023-05-16: qty 5

## 2023-05-16 MED ORDER — SODIUM CHLORIDE 0.9 % IV SOLN
1.0000 g | INTRAVENOUS | Status: DC
  Administered 2023-05-17 – 2023-05-18 (×2): 1 g via INTRAVENOUS
  Filled 2023-05-16 (×3): qty 10

## 2023-05-16 MED ORDER — ORAL CARE MOUTH RINSE
15.0000 mL | OROMUCOSAL | Status: DC
  Administered 2023-05-17 (×8): 15 mL via OROMUCOSAL

## 2023-05-16 MED ORDER — FENTANYL CITRATE PF 50 MCG/ML IJ SOSY
50.0000 ug | PREFILLED_SYRINGE | INTRAMUSCULAR | Status: DC | PRN
  Administered 2023-05-17: 50 ug via INTRAVENOUS

## 2023-05-16 MED ORDER — FAMOTIDINE IN NACL 20-0.9 MG/50ML-% IV SOLN
20.0000 mg | Freq: Two times a day (BID) | INTRAVENOUS | Status: DC
  Administered 2023-05-17 – 2023-05-18 (×4): 20 mg via INTRAVENOUS
  Filled 2023-05-16 (×4): qty 50
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
## 2023-05-16 NOTE — Progress Notes (Signed)
 Pt transported back from [facility] to [facility] without any complications.
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
## 2023-05-16 NOTE — Progress Notes (Signed)
 eLink Physician-Brief Progress Note Patient Name: Ivelisse Culverhouse DOB: Apr 13, 1970 MRN: [id number]   Date of Service  05/16/2023  HPI/Events of Note  62 F ETOH abuse and cocaine use, COPD, depression, s/p ORIF left humerus, brought in after being found down. Given Narcan by EMS without improvement. Had seizure and vomited, intubated. Hypothermic with borderline BP given fluids.  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 K 6.3 improved with fluids. H/H 18/53. Alcohol 574, positive for cocaine and benzos, given benzos during th seizure episode.  Received request for prn sedation, has propofol ordered that has not been started as SBP 70s  eICU Interventions  Altered sensorium from intoxication and substance use. Prn Fentanyl ordered for agiatiaon Hypotension, ordered another 500 cc LR bolus as patient appears hemoconcentrated.  Peripheral norepinephrine ordered to start if BP remains low. A correctly placed ivWatch must be used when administering Vasopressors. Should this treatment be needed beyond 72 hours, central line access should be obtained.  It will be the responsibility of the bedside nurse to follow best practice to prevent extravasations.       Intervention Category Intermediate Interventions: Pain - evaluation and management Evaluation Type: New Patient Evaluation  Darl Pikes 05/16/2023, 11:42 PM

## 2023-05-16 NOTE — H&P (Signed)
 NAME:  Charlene Woods, MRN:  [id number], DOB:  06-18-1970, LOS: 0 ADMISSION DATE:  05/16/2023, CONSULTATION DATE:  05/16/2023 REFERRING MD:  Tilden Fossa, MD, CHIEF COMPLAINT:  Found Down  History of Present Illness:  53 y/o female who has a PMH for COPD, ETOH abuse, Drug abuse, Anxiety, Depression, S/p ORIF Left Humerus presents to EMS for AMS. Pt was found lying facedown at a Motel, unresponsive for unknown time. Per bystanders, hx of cocaine use. On EMS arrival, pt with pinpoint pupils, apneic. 1mg  Narcan given without improvement. Pt then vomited, seized, given 5mg  IM versed, intubated. Started biting and gagging. Given Fentanyl. Pt then had seizure like activity enroute and given another 5mg  IM of versed.    EMS intubated with 7.0 tube, 20g IV to L AC, 20g IV to R forearm. VS 80, bp 136/87. ET 26-35. C-collar in place; Abrasion noted to forehead. Questionabe Seizures x 2 per EMS, none in the ED.  Pertinent  Medical History  COPD ETOH/Drug abuse Significant Hospital Events: Including procedures, antibiotic start and stop dates in addition to other pertinent events   3/15: Found down, intubated in the field  Interim History / Subjective:  N/a  Objective   Blood pressure 109/86, pulse 82, temperature (!) 94.8 F (34.9 C), temperature source Rectal, resp. rate 20, height 5\' 3"  (1.6 m), weight 52.2 kg, SpO2 100%.        Intake/Output Summary (Last 24 hours) at 05/16/2023 2122 Last data filed at 05/16/2023 2016 Gross per 24 hour  Intake --  Output 700 ml  Net -700 ml   Filed Weights   05/16/23 2008  Weight: 52.2 kg    Examination: General: intubated NAD HENT: pupils round 3mm, injected sclera, no teeth Lungs: CTA no wheezes no rales Cardiovascular: reg s1s2 no murmurs Abdomen: soft nt nd bs pos Extremities: no cyanosis, clubbing or edema Neuro: sedated on mech vent   Resolved Hospital Problem list   N/a  Assessment & Plan:  Found down unresponsive/AMS-CT  head and CTA head as there is possible aneurysm, EEG for morning Acute respiratory failure, hypoxic type, now intubated, Vent management Pneumonia-possible aspiration, Broad spectrum antibiotics Lactic Acidosis-from being found down, get CPK, IVF Hyperkalemia-resolving Possible Aneurysm-supraclinoid, CTA head pending  Admit to ICU Best Practice (right click and "Reselect all SmartList Selections" daily)   Diet/type: NPO w/ meds via tube DVT prophylaxis LMWH Pressure ulcer(s): N/A GI prophylaxis: H2B Lines: N/A Foley:  N/A Code Status:  full code   Labs   CBC: Recent Labs  Lab 05/16/23 1953 05/16/23 2006  WBC 12.8*  --   NEUTROABS 6.2  --   HGB 17.9* 18.0*  17.3*  HCT 52.6* 53.0*  51.0*  MCV 96.9  --   PLT 336  --     Basic Metabolic Panel: Recent Labs  Lab 05/16/23 2006  NA 134*  134*  K 6.3*  6.3*  CL 108  GLUCOSE 123*  BUN 15  CREATININE 1.00   GFR: Estimated Creatinine Clearance: 55.5 mL/min (by C-G formula based on SCr of 1 mg/dL). Recent Labs  Lab 05/16/23 1953 05/16/23 2007  WBC 12.8*  --   LATICACIDVEN  --  4.2*    Liver Function Tests: No results for input(s): "AST", "ALT", "ALKPHOS", "BILITOT", "PROT", "ALBUMIN" in the last 168 hours. No results for input(s): "LIPASE", "AMYLASE" in the last 168 hours. No results for input(s): "AMMONIA" in the last 168 hours.  ABG    Component Value Date/Time  HCO3 17.4 (L) 05/16/2023 2006   TCO2 18 (L) 05/16/2023 2006   TCO2 18 (L) 05/16/2023 2006   ACIDBASEDEF 6.0 (H) 05/16/2023 2006   O2SAT 99 05/16/2023 2006     Coagulation Profile: No results for input(s): "INR", "PROTIME" in the last 168 hours.  Cardiac Enzymes: No results for input(s): "CKTOTAL", "CKMB", "CKMBINDEX", "TROPONINI" in the last 168 hours.  HbA1C: No results found for: "HGBA1C"  CBG: No results for input(s): "GLUCAP" in the last 168 hours.  Review of Systems:   Unable to obtain, patient intubated sedated  Past Medical  History:  She,  has a past medical history of Alcohol abuse, Anxiety, Asthma, COPD (chronic obstructive pulmonary disease) (HCC), Depression, and Teeth missing.   Surgical History:   Past Surgical History:  Procedure Laterality Date   LUMBAR WOUND DEBRIDEMENT N/A 11/05/2019   Procedure: Incision and Drainage of Open Lumbar Spine Fracture;  Surgeon: Tressie Stalker, MD;  Location: [facility] OR;  Service: Neurosurgery;  Laterality: N/A;   ORIF HUMERUS FRACTURE Left 11/09/2019   Procedure: OPEN REDUCTION INTERNAL FIXATION (ORIF) PROXIMAL HUMERUS FRACTURE;  Surgeon: Roby Lofts, MD;  Location: [facility] OR;  Service: Orthopedics;  Laterality: Left;   TUBAL LIGATION       Social History:   reports that she has been smoking cigarettes. She has never used smokeless tobacco. She reports current alcohol use of about 3.0 standard drinks of alcohol per week. She reports that she does not use drugs.   Family History:  Her family history includes Breast cancer (age of onset: 78) in her mother; Diabetes in her mother. There is no history of Ovarian cancer or Colon cancer.   Allergies Allergies  Allergen Reactions   Asa [Aspirin] Shortness Of Breath   Bee Pollen Anaphylaxis   Bee Venom Anaphylaxis   Shellfish Allergy Anaphylaxis   Aspirin    Bee Pollen    Codeine    Codeine Other (See Comments)    GI upset   Morphine And Codeine    Morphine And Codeine Other (See Comments)    GI upset   Penicillins    Penicillins Other (See Comments)    Reaction unknown     Home Medications  Prior to Admission medications   Medication Sig Start Date End Date Taking? Authorizing Provider  albuterol (VENTOLIN HFA) 108 (90 Base) MCG/ACT inhaler Inhale 2 puffs into the lungs every 4 (four) hours as needed for wheezing or shortness of breath.  07/09/19   [provider]  amLODipine (NORVASC) 5 MG tablet Take 5 mg by mouth daily. 02/12/18   [provider]  amLODipine (NORVASC) 5 MG tablet Take 5 mg by  mouth at bedtime. 07/09/19   [provider]  polyethylene glycol (MIRALAX / GLYCOLAX) 17 g packet Take 17 g by mouth daily as needed for mild constipation. 11/11/19   Meuth, Lina Sar, PA-C  Prenatal Vit-Fe Fumarate-FA (PRENATAL MULTIVITAMIN) TABS tablet Take 1 tablet by mouth daily at 12 noon.    [provider]  varenicline (CHANTIX) 0.5 MG tablet Take one tablet daily days 1-3. Then take two tablets daily days 4-7. After one week, increase to two tablets twice per day and continue for at least 11 weeks. 08/18/19   Arvilla Market, MD     Critical care time: 47   The patient is critically ill with multiple organ system failure and requires high complexity decision making for assessment and support, frequent evaluation and titration of therapies, advanced monitoring, review of radiographic  studies and interpretation of complex data.   Critical Care Time devoted to patient care services, exclusive of separately billable procedures, described in this note is 32 minutes.   Rozann Lesches, MD [facility] See Amion for pager  If no response to pager , please call [phone] until 7pm After 7:00 pm call Elink  [phone] 05/16/2023, 9:22 PM

## 2023-05-16 NOTE — Progress Notes (Signed)
 Pt transported from ED to CT1 then to 4N23 on ventilator without complication.

## 2023-05-16 NOTE — ED Triage Notes (Signed)
 CCMD contacted for monitoring

## 2023-05-16 NOTE — Progress Notes (Signed)
 Pt transported from 033C to CT4 without any complications.

## 2023-05-17 ENCOUNTER — Other Ambulatory Visit: Payer: Self-pay

## 2023-05-17 ENCOUNTER — Inpatient Hospital Stay (HOSPITAL_COMMUNITY)

## 2023-05-17 DIAGNOSIS — E8729 Other acidosis: Secondary | ICD-10-CM | POA: Diagnosis not present

## 2023-05-17 DIAGNOSIS — R4182 Altered mental status, unspecified: Secondary | ICD-10-CM

## 2023-05-17 DIAGNOSIS — F1090 Alcohol use, unspecified, uncomplicated: Secondary | ICD-10-CM | POA: Diagnosis not present

## 2023-05-17 DIAGNOSIS — J9601 Acute respiratory failure with hypoxia: Secondary | ICD-10-CM

## 2023-05-17 DIAGNOSIS — J189 Pneumonia, unspecified organism: Secondary | ICD-10-CM

## 2023-05-17 DIAGNOSIS — R569 Unspecified convulsions: Secondary | ICD-10-CM | POA: Diagnosis not present

## 2023-05-17 DIAGNOSIS — F1490 Cocaine use, unspecified, uncomplicated: Secondary | ICD-10-CM | POA: Diagnosis not present

## 2023-05-17 LAB — POCT I-STAT 7, (LYTES, BLD GAS, ICA,H+H)
Acid-base deficit: 6 mmol/L — ABNORMAL HIGH (ref 0.0–2.0)
Bicarbonate: 20.9 mmol/L (ref 20.0–28.0)
Calcium, Ion: 1.09 mmol/L — ABNORMAL LOW (ref 1.15–1.40)
HCT: 44 % (ref 36.0–46.0)
Hemoglobin: 15 g/dL (ref 12.0–15.0)
O2 Saturation: 99 %
Patient temperature: 37.4
Potassium: 3.8 mmol/L (ref 3.5–5.1)
Sodium: 145 mmol/L (ref 135–145)
TCO2: 22 mmol/L (ref 22–32)
pCO2 arterial: 48.2 mmHg — ABNORMAL HIGH (ref 32–48)
pH, Arterial: 7.248 — ABNORMAL LOW (ref 7.35–7.45)
pO2, Arterial: 180 mmHg — ABNORMAL HIGH (ref 83–108)

## 2023-05-17 LAB — BASIC METABOLIC PANEL
Anion gap: 9 (ref 5–15)
BUN: 9 mg/dL (ref 6–20)
CO2: 20 mmol/L — ABNORMAL LOW (ref 22–32)
Calcium: 7.3 mg/dL — ABNORMAL LOW (ref 8.9–10.3)
Chloride: 111 mmol/L (ref 98–111)
Creatinine, Ser: 0.69 mg/dL (ref 0.44–1.00)
GFR, Estimated: >60 mL/min (ref >60)
Glucose, Bld: 108 mg/dL — ABNORMAL HIGH (ref 70–99)
Potassium: 3.7 mmol/L (ref 3.5–5.1)
Sodium: 140 mmol/L (ref 135–145)

## 2023-05-17 LAB — HEMOGLOBIN A1C
Hgb A1c MFr Bld: 5.3 % (ref 4.8–5.6)
Mean Plasma Glucose: 105.41 mg/dL

## 2023-05-17 LAB — HCG, SERUM, QUALITATIVE: Preg, Serum: NEGATIVE

## 2023-05-17 LAB — CBC
HCT: 42.3 % (ref 36.0–46.0)
Hemoglobin: 14.4 g/dL (ref 12.0–15.0)
MCH: 32.9 pg (ref 26.0–34.0)
MCHC: 34 g/dL (ref 30.0–36.0)
MCV: 96.6 fL (ref 80.0–100.0)
Platelets: 309 10*3/uL (ref 150–400)
RBC: 4.38 MIL/uL (ref 3.87–5.11)
RDW: 12.7 % (ref 11.5–15.5)
WBC: 9.1 10*3/uL (ref 4.0–10.5)
nRBC: 0 % (ref 0.0–0.2)

## 2023-05-17 LAB — GLUCOSE, CAPILLARY
Glucose-Capillary: 113 mg/dL — ABNORMAL HIGH (ref 70–99)
Glucose-Capillary: 104 mg/dL — ABNORMAL HIGH (ref 70–99)
Glucose-Capillary: 82 mg/dL (ref 70–99)
Glucose-Capillary: 106 mg/dL — ABNORMAL HIGH (ref 70–99)
Glucose-Capillary: 91 mg/dL (ref 70–99)
Glucose-Capillary: 94 mg/dL (ref 70–99)

## 2023-05-17 LAB — CREATININE, SERUM
Creatinine, Ser: 0.7 mg/dL (ref 0.44–1.00)
GFR, Estimated: >60 mL/min (ref >60)

## 2023-05-17 LAB — PHOSPHORUS: Phosphorus: 4.1 mg/dL (ref 2.5–4.6)

## 2023-05-17 LAB — MRSA NEXT GEN BY PCR, NASAL: MRSA by PCR Next Gen: NOT DETECTED

## 2023-05-17 LAB — MAGNESIUM: Magnesium: 1.9 mg/dL (ref 1.7–2.4)

## 2023-05-17 LAB — HIV ANTIBODY (ROUTINE TESTING W REFLEX): HIV Screen 4th Generation wRfx: NONREACTIVE

## 2023-05-17 MED ORDER — FENTANYL BOLUS VIA INFUSION
50.0000 ug | INTRAVENOUS | Status: DC | PRN
Start: 2023-05-17 — End: 2023-05-17
  Administered 2023-05-17: 50 ug via INTRAVENOUS

## 2023-05-17 MED ORDER — LACTATED RINGERS IV SOLN: INTRAVENOUS | Status: AC

## 2023-05-17 MED ORDER — NOREPINEPHRINE 4 MG/250ML-% IV SOLN
2.0000 ug/min | INTRAVENOUS | Status: DC
  Administered 2023-05-17: 5 ug/min via INTRAVENOUS
  Filled 2023-05-17: qty 250

## 2023-05-17 MED ORDER — FENTANYL 2500MCG IN NS 250ML (10MCG/ML) PREMIX INFUSION
50.0000 ug/h | INTRAVENOUS | Status: DC
  Administered 2023-05-17: 50 ug/h via INTRAVENOUS
  Filled 2023-05-17 (×2): qty 250

## 2023-05-17 MED ORDER — SODIUM CHLORIDE 0.9 % IV SOLN
250.0000 mL | INTRAVENOUS | Status: AC
  Administered 2023-05-17: 250 mL via INTRAVENOUS

## 2023-05-17 MED ORDER — POTASSIUM CHLORIDE 10 MEQ/100ML IV SOLN
10.0000 meq | INTRAVENOUS | Status: AC
  Administered 2023-05-17 (×4): 10 meq via INTRAVENOUS
  Filled 2023-05-17 (×4): qty 100

## 2023-05-17 MED ORDER — ORAL CARE MOUTH RINSE: 15.0000 mL | OROMUCOSAL | Status: DC | PRN

## 2023-05-17 MED ORDER — DEXMEDETOMIDINE HCL IN NACL 400 MCG/100ML IV SOLN
INTRAVENOUS | Status: AC
  Filled 2023-05-17: qty 100

## 2023-05-17 MED ORDER — LACTATED RINGERS IV BOLUS
1000.0000 mL | Freq: Once | INTRAVENOUS | Status: AC
  Administered 2023-05-17: 1000 mL via INTRAVENOUS

## 2023-05-17 MED ORDER — GADOBUTROL 1 MMOL/ML IV SOLN
5.0000 mL | Freq: Once | INTRAVENOUS | Status: AC | PRN
  Administered 2023-05-17: 5 mL via INTRAVENOUS

## 2023-05-17 MED ORDER — LACTATED RINGERS IV BOLUS
500.0000 mL | Freq: Once | INTRAVENOUS | Status: AC
  Administered 2023-05-17: 500 mL via INTRAVENOUS

## 2023-05-17 MED ORDER — MAGNESIUM SULFATE 2 GM/50ML IV SOLN
2.0000 g | Freq: Once | INTRAVENOUS | Status: AC
  Administered 2023-05-17: 2 g via INTRAVENOUS
  Filled 2023-05-17: qty 50

## 2023-05-17 MED ORDER — DEXMEDETOMIDINE HCL IN NACL 400 MCG/100ML IV SOLN
0.0000 ug/kg/h | INTRAVENOUS | Status: DC
  Administered 2023-05-17: 0.4 ug/kg/h via INTRAVENOUS

## 2023-05-17 MED ORDER — ACETAMINOPHEN 160 MG/5ML PO SOLN
650.0000 mg | ORAL | Status: DC | PRN
Start: 2023-05-17 — End: 2023-05-19
  Administered 2023-05-17 (×2): 650 mg
  Filled 2023-05-17 (×2): qty 20.3

## 2023-05-17 NOTE — Progress Notes (Signed)
 eLink Physician-Brief Progress Note [name] DOB: 06-25-1970 MRN: [id number]   Date of Service  05/17/2023  HPI/Events of Note  Temp 38 request for prn Tylenol Already on Ceftriaxone and azithromcyin  eICU Interventions  Prn Tylenol ordered     Intervention Category Intermediate Interventions: Infection - evaluation and management Minor Interventions: Routine modifications to care plan (e.g. PRN medications for pain, fever)  Rosalie Gums Cassell Voorhies 05/17/2023, 6:42 AM

## 2023-05-17 NOTE — Procedures (Signed)
 Extubation Procedure Note  Patient Details:   Name: Charlene Woods DOB: 11/11/1970 MRN: [id number]   Airway Documentation:    Vent end date: 05/17/23 Vent end time: 1650   Evaluation  O2 sats: stable throughout Complications: No apparent complications Patient did tolerate procedure well. Bilateral Breath Sounds: Clear, Diminished   Yes, pt could speak post extubation.  Pt extubated to 4 l/m [place] without difficulty.  Audrie Lia 05/17/2023, 4:50 PM

## 2023-05-17 NOTE — Consult Note (Signed)
 NEUROLOGY CONSULT NOTE   Date of service: May 17, 2023 [name] MRN:  [id number] DOB:  Jul 21, 1970 Chief Complaint: "seizures, intubated" Requesting Provider: Rozann Lesches, MD  History of Present Illness  Avneet Ashmore is a 53 y.o. female with hx of alcohol use, cocaine use, depression, COPD, cocaine use who was brought in after being found down by EMS, facedown in a motel confused and poorly responsive.  She was given Narcan without any improvement.  She vomited and had seizure.  She was given 5 mg of IM Versed and intubated in the field.  Patient started waking up a little bit more and was given 100 mcg of fentanyl and then had another seizure and route.  She was given an additional 5 mg of Versed.  In the ED, she had CT head without contrast which was negative for any acute intracranial abnormalities.  She had CT angio the head and neck which was negative for large vessel occlusion or basilar thrombosis.  Neurology was consulted for further evaluation and workup for possible seizures.  She was loaded with Keppra 3000 mg IV once.  On my evaluation, patient is intubated and sedated.  With noxious stimuli, patient is tries to sit up in the bed and noted to be moving all extremities.  She is unable to answer any questions or follow any commands.  Vitals were noted with hypothermia, she has elevated white cell count, urine drug screen is positive for cocaine but also positive for benzodiazepine which I think is probably because EMS gave her some Versed.  EtOH levels are elevated to 574.   ROS  Unable to ascertain due to intubation and sedation.  Past History   Past Medical History:  Diagnosis Date   Alcohol abuse    Anxiety    Asthma    COPD (chronic obstructive pulmonary disease) (HCC)    Depression    Teeth missing     Past Surgical History:  Procedure Laterality Date   LUMBAR WOUND DEBRIDEMENT N/A 11/05/2019   Procedure: Incision and Drainage of Open Lumbar  Spine Fracture;  Surgeon: Tressie Stalker, MD;  Location: [facility] OR;  Service: Neurosurgery;  Laterality: N/A;   ORIF HUMERUS FRACTURE Left 11/09/2019   Procedure: OPEN REDUCTION INTERNAL FIXATION (ORIF) PROXIMAL HUMERUS FRACTURE;  Surgeon: Roby Lofts, MD;  Location: [facility] OR;  Service: Orthopedics;  Laterality: Left;   TUBAL LIGATION      Family History: Family History  Problem Relation Age of Onset   Breast cancer Mother 49   Diabetes Mother    Ovarian cancer Neg Hx    Colon cancer Neg Hx     Social History  reports that she has been smoking cigarettes. She has never used smokeless tobacco. She reports current alcohol use of about 3.0 standard drinks of alcohol per week. She reports that she does not use drugs.  Allergies  Allergen Reactions   Asa [Aspirin] Shortness Of Breath   Bee Pollen Anaphylaxis   Bee Venom Anaphylaxis   Shellfish Allergy Anaphylaxis   Aspirin    Bee Pollen    Codeine    Codeine Other (See Comments)    GI upset   Morphine And Codeine    Morphine And Codeine Other (See Comments)    GI upset   Penicillins    Penicillins Other (See Comments)    Reaction unknown    Medications   Current Facility-Administered Medications:    0.9 %  sodium chloride infusion, 250 mL, Intravenous,  Continuous, Darl Pikes, MD, Last Rate: 10 mL/hr at 05/17/23 0500, Infusion Verify at 05/17/23 0500   azithromycin (ZITHROMAX) 500 mg in sodium chloride 0.9 % 250 mL IVPB, 500 mg, Intravenous, Q24H, Rozann Lesches, MD   cefTRIAXone (ROCEPHIN) 1 g in sodium chloride 0.9 % 100 mL IVPB, 1 g, Intravenous, Q24H, Rozann Lesches, MD   Chlorhexidine Gluconate Cloth 2 % PADS 6 each, 6 each, Topical, Daily, Rozann Lesches, MD, 6 each at 05/16/23 2300   docusate (COLACE) 50 MG/5ML liquid 100 mg, 100 mg, Per Tube, BID PRN, Arabella Merles, RPH   famotidine (PEPCID) IVPB 20 mg premix, 20 mg, Intravenous, Q12H, Rozann Lesches, MD, Stopped at 05/17/23 0107   fentaNYL (SUBLIMAZE) bolus via infusion  50-100 mcg, 50-100 mcg, Intravenous, Q15 min PRN, Violet Baldy, Rosalie Gums, MD   fentaNYL in NS (53mcg/ml) infusion-PREMIX, 50-200 mcg/hr, Intravenous, Continuous, [place]ura, Irving Burton T, MD, Last Rate: 5 mL/hr at 05/17/23 0500, 50 mcg/hr at 05/17/23 0500   heparin injection 5,000 Units, 5,000 Units, Subcutaneous, Q8H, Rozann Lesches, MD, 5,000 Units at 05/17/23 0528   insulin aspart (novoLOG) injection 0-15 Units, 0-15 Units, Subcutaneous, Q4H, Rozann Lesches, MD, 2 Units at 05/17/23 0019   norepinephrine (LEVOPHED) 4mg  in (0.016 mg/mL) premix infusion, 2-10 mcg/min, Intravenous, Titrated, Aventura, Rosalie Gums, MD, Last Rate: 18.75 mL/hr at 05/17/23 0500, 5 mcg/min at 05/17/23 0500   ondansetron (ZOFRAN) injection 4 mg, 4 mg, Intravenous, Q6H PRN, Rozann Lesches, MD   Oral care mouth rinse, 15 mL, Mouth Rinse, Q2H, Rozann Lesches, MD, 15 mL at 05/17/23 1610   Oral care mouth rinse, 15 mL, Mouth Rinse, PRN, Rozann Lesches, MD   polyethylene glycol (MIRALAX / GLYCOLAX) packet 17 g, 17 g, Per Tube, Daily PRN, Rozann Lesches, MD   potassium chloride 10 mEq in 100 mL IVPB, 10 mEq, Intravenous, Q1 Hr x 4, Aventura, Emily T, MD, Last Rate: 90 mL/hr at 05/17/23 0527, 10 mEq at 05/17/23 0527   propofol (DIPRIVAN) 1000 MG/100ML infusion, 5-80 mcg/kg/min, Intravenous, Continuous, Tilden Fossa, MD, Last Rate: 4.7 mL/hr at 05/17/23 0500, 15 mcg/kg/min at 05/17/23 0500  Vitals   Vitals:   05/17/23 0445 05/17/23 0500 05/17/23 0515 05/17/23 0530  BP: 101/66 105/67 104/71 104/69  Pulse: 83 82 83 83  Resp: (!) 25 (!) 25 (!) 25 (!) 25  Temp: 99.7 F (37.6 C) 99.9 F (37.7 C) 99.9 F (37.7 C) 100 F (37.8 C)  TempSrc:      SpO2: 100% 100% 100% 100%  Weight:      Height:        Body mass index is 20.62 kg/m.  Physical Exam   General: Laying comfortably in bed; intubated. HENT: Normal oropharynx and mucosa. Normal external appearance of ears and nose.  Neck: Supple, no pain or tenderness  CV: No JVD. No  peripheral edema.  Pulmonary: Symmetric Chest rise.  Breathing over vent with intermittent vent dyssynchrony. Abdomen: Soft to touch, non-tender. Ext: No cyanosis, edema, or deformity  Skin: No rash. Normal palpation of skin.   Musculoskeletal: Normal digits and nails by inspection. No clubbing.   Neurologic Examination  Mental status/Cognition: Slight grimace to loud voice and to vigorous tactile stimulation.  She is moving all extremities to noxious stimuli and attempting to sit up in the bed. Speech/language: Mute, no speech secondary to intubation and sedation.  She does not follow commands or answer any questions. cranial nerves:   CN II Pupils equal and reactive to light, unable to assess for  visual field deficit.   CN III,IV,VI Unable to assess EOMI secondary to patient being in c-collar.  No gaze preference or deviation, no nystagmus noted.   CN V Corneals are intact bilaterally.   CN VII Symmetric facial grimace noted to noxious stimuli.   CN VIII She does not turn her head towards the speech.   CN IX & X Cough and gag is intact.  In fact, she is spontaneously coughing on the ETT.   CN XI Unable to assess as patient is in c-collar.   CN XII Unable to assess.   Sensory/motor:  Muscle bulk: Poor, tone flaccid in all extremities. To attempting cough reflex, patient noted to attempt to sit up in the bed and and moving all of her extremities.  Coordination/Complex Motor:  Unable to assess coordination.  Labs/Imaging/Neurodiagnostic studies   CBC:  Recent Labs  Lab May 18, 2023 1953 May 18, 2023 2006 05/17/23 0048 05/17/23 0412  WBC 12.8*  --  9.1  --   NEUTROABS 6.2  --   --   --   HGB 17.9*   < > 14.4 15.0  HCT 52.6*   < > 42.3 44.0  MCV 96.9  --  96.6  --   PLT 336  --  309  --    < > = values in this interval not displayed.   Basic Metabolic Panel:  Lab Results  Component Value Date   NA 145 05/17/2023   K 3.8 05/17/2023   CO2 20 (L) 05/17/2023   GLUCOSE 108 (H)  05/17/2023   BUN 9 05/17/2023   CREATININE 0.70 05/17/2023   CREATININE 0.69 05/17/2023   CALCIUM 7.3 (L) 05/17/2023   GFRNONAA >60 05/17/2023   GFRNONAA >60 05/17/2023   GFRAA >60 11/10/2019   Lipid Panel: No results found for: "LDLCALC" HgbA1c:  Lab Results  Component Value Date   HGBA1C 5.3 05/17/2023   Urine Drug Screen:     Component Value Date/Time   LABOPIA NONE DETECTED 2023-05-18 2156   COCAINSCRNUR POSITIVE (A) 05/18/23 2156   COCAINSCRNUR POSITIVE (A) 05/14/2018 0944   LABBENZ POSITIVE (A) May 18, 2023 2156   AMPHETMU NONE DETECTED 05/18/23 2156   THCU NONE DETECTED May 18, 2023 2156   LABBARB NONE DETECTED 05/18/23 2156    Alcohol Level     Component Value Date/Time   ETH 574 (HH) 05/18/2023 1953   INR  Lab Results  Component Value Date   INR 0.9 11/05/2019   APTT No results found for: "APTT" AED levels: No results found for: "PHENYTOIN", "ZONISAMIDE", "LAMOTRIGINE", "LEVETIRACETA"  CT Head without contrast(Personally reviewed): CTH was negative for a large hypodensity concerning for a large territory infarct or hyperdensity concerning for an ICH  CT angio Head and Neck with contrast(Personally reviewed): No LVO  MRI Brain: pending  Neurodiagnostics cEEG:  Pending  ASSESSMENT   Laressa Bolinger is a 53 y.o. female with hx of alcohol use, cocaine use, depression, COPD, cocaine use who was brought in after being found down by EMS and had 2 witnessed seizures and intubated.  Her urine drug screen is positive for cocaine and her blood alcohol levels are elevated to 574.  I suspect that her seizures were probably provoked by cocaine use.  She is not having any clinical seizures at this time.  CT head without contrast with no hemorrhage or large strokes.  CT angio of the head and neck with no large vessel occlusion.  In the presence of an alternative etiology for her provoked seizure, my overall suspicion  for this being meningitis is low.  However, if  the patient does end up spiking fever or having recurrent seizures, she may benefit from getting further workup with a lumbar puncture.  RECOMMENDATIONS  -Agree with Keppra load of 3000 mg IV once. -Continuous EEG in AM. -Seizure precautions with seizure pads. -Neurology will continue to follow. ______________________________________________________________________  This patient is critically ill and at significant risk of neurological worsening, death and care requires constant monitoring of vital signs, hemodynamics,respiratory and cardiac monitoring, neurological assessment, discussion with family, other specialists and medical decision making of high complexity. I spent 40 minutes of neurocritical care time  in the care of  this patient. This was time spent independent of any time provided by nurse practitioner or PA.  Erick Blinks [facility] 05/17/2023  6:05 AM  Signed, Erick Blinks, MD [facility]

## 2023-05-17 NOTE — Progress Notes (Signed)
 NAME:  Karin Pinedo, MRN:  [id number], DOB:  06/27/1970, LOS: 1 ADMISSION DATE:  05/16/2023, CONSULTATION DATE:  05/16/2023 REFERRING MD:  Tilden Fossa, MD, CHIEF COMPLAINT:  Found Down  History of Present Illness:  53 y/o female who has a PMH for COPD, ETOH abuse, Drug abuse, Anxiety, Depression, S/p ORIF Left Humerus presents to EMS for AMS. Pt was found lying facedown at a [employer], unresponsive for unknown time. Per bystanders, hx of cocaine use. On EMS arrival, pt with pinpoint pupils, apneic. 1mg  Narcan given without improvement. Pt then vomited, seized, given 5mg  IM versed, intubated. Started biting and gagging. Given Fentanyl. Pt then had seizure like activity enroute and given another 5mg  IM of versed.    EMS intubated with 7.0 tube, 20g IV to L AC, 20g IV to R forearm. VS 80, bp 136/87. ET 26-35. C-collar in place; Abrasion noted to forehead. Questionabe Seizures x 2 per EMS, none in the ED.  Pertinent  Medical History  COPD ETOH/Drug abuse  Significant Hospital Events: Including procedures, antibiotic start and stop dates in addition to other pertinent events   3/15: Found down, intubated in the field  Interim History / Subjective:  No further seizures.   Objective   Blood pressure 118/70, pulse (!) 57, temperature (!) 100.9 F (38.3 C), resp. rate 12, height 5\' 3"  (1.6 m), weight 52.8 kg, SpO2 100%.    Vent Mode: PSV;CPAP FiO2 (%):  [30 %-100 %] 30 % Set Rate:  [22 bmp-25 bmp] 25 bmp Vt Set:  [420 mL] 420 mL PEEP:  [5 cmH20] 5 cmH20 Pressure Support:  [8 cmH20-10 cmH20] 8 cmH20 Plateau Pressure:  [16 cmH20] 16 cmH20   Intake/Output Summary (Last 24 hours) at 05/17/2023 1642 Last data filed at 05/17/2023 1400 Gross per 24 hour  Intake 5062.04 ml  Output 3055 ml  Net 2007.04 ml   Filed Weights   05/16/23 2008 05/17/23 0313  Weight: 52.2 kg 52.8 kg    Examination: General: intubated NAD HENT: pupils round 3mm, injected sclera, no teeth Lungs: CTA no  wheezes no rales, tolerated SBT  Cardiovascular: reg s1s2 no murmurs Abdomen: soft nt nd bs pos Extremities: no cyanosis, clubbing or edema Neuro: off sedation, still somnolent but will now follow commands and move all limbs with normal strength.    Ancillary tests personally reviewed.   Chemistry unremarkable   Assessment & Plan:  Found down unresponsive/AMS-CT head and CTA head as there is possible aneurysm, EEG for morning Acute respiratory failure, hypoxic type, now intubated, Vent management Pneumonia-possible aspiration, Broad spectrum antibiotics Lactic Acidosis-from being found down, get CPK, IVF Hyperkalemia-resolving Possible Aneurysm-supraclinoid, CTA head pending  Plan:  - Extubate - Keep off sedation - Elective EEG, but likely drugs related.   Best Practice (right click and "Reselect all SmartList Selections" daily)   Diet/type: NPO w/ meds via tube DVT prophylaxis LMWH Pressure ulcer(s): N/A GI prophylaxis: H2B Lines: N/A Foley:  N/A Code Status:  full code  CRITICAL CARE Performed by: Lynnell Catalan   Total critical care time: 40 minutes  Critical care time was exclusive of separately billable procedures and treating other patients.  Critical care was necessary to treat or prevent imminent or life-threatening deterioration.  Critical care was time spent personally by me on the following activities: development of treatment plan with patient and/or surrogate as well as nursing, discussions with consultants, evaluation of patient's response to treatment, examination of patient, obtaining history from patient or surrogate, ordering and performing treatments  and interventions, ordering and review of laboratory studies, ordering and review of radiographic studies, pulse oximetry, re-evaluation of patient's condition and participation in multidisciplinary rounds.  Lynnell Catalan, MD [facility] ICU Physician [facility] [place] [facility]  Pager: [phone] Mobile:  [phone] After hours: [phone].

## 2023-05-17 NOTE — Progress Notes (Signed)
 Neurology brief note  Patient is off sedation and CCM plans to extubate. Will hold off on EEG until patient is able to participate given that there is no concern for ongoing convulsive seizures.  Bing Neighbors, MD [facility] [phone]  If 7pm- 7am, please page neurology on call as listed in AMION.

## 2023-05-17 NOTE — Progress Notes (Signed)
 [facility] ADULT ICU REPLACEMENT PROTOCOL   The patient does apply for the [facility] Adult [facility] Electrolyte Replacment Protocol based on the criteria listed below:   1.Exclusion criteria: TCTS, ECMO, Dialysis, and Myasthenia Gravis patients 2. Is GFR >/= 30 ml/min? Yes.    Patient's GFR today is >60 3. Is SCr </= 2? Yes.   Patient's SCr is 0.70 mg/dL 4. Did SCr increase >/= 0.5 in 24 hours? No. 5.Pt's weight >40kg  Yes.   6. Abnormal electrolyte(s): K, Mag  7. Electrolytes replaced per protocol 8.  Call MD STAT for K+ </= 2.5, Phos </= 1, or Mag </= 1 Physician:  Forbes Cellar Ashyah Quizon 05/17/2023 2:40 AM

## 2023-05-17 NOTE — Progress Notes (Signed)
 eLink Physician-Brief Progress Note Patient Name: Lorretta Kerce DOB: 06-30-1970 MRN: [id number]   Date of Service  05/17/2023  HPI/Events of Note  Request for restraints. Patient starting to wake up. Remains intubated and is at risk for self harm by puling lines and tubes.  Requesting continuous fent gtt too.   eICU Interventions  Bilateral soft wrist restraints ordered Bedside team to assess in am if restraints to be continued Fentanyl drip ordered to titrate accordingly     Intervention Category Minor Interventions: Agitation / anxiety - evaluation and management  Darl Pikes 05/17/2023, 1:25 AM

## 2023-05-17 NOTE — Plan of Care (Signed)
  Problem: Coping: Goal: Ability to adjust to condition or change in health will improve Outcome: Not Progressing   Problem: Fluid Volume: Goal: Ability to maintain a balanced intake and output will improve Outcome: Not Progressing   Problem: Health Behavior/Discharge Planning: Goal: Ability to identify and utilize available resources and services will improve Outcome: Not Progressing Goal: Ability to manage health-related needs will improve Outcome: Not Progressing

## 2023-05-17 NOTE — Progress Notes (Signed)
 eLink Physician-Brief Progress Note [name] DOB: January 02, 1971 MRN: [id number]   Date of Service  05/17/2023  HPI/Events of Note  Request for fent bolus from bag order, so the bedside RN can titrate the fent gtt per order.  eICU Interventions  Changed Fentanyl to per bolus from bag     Intervention Category Intermediate Interventions: Medication change / dose adjustment  Darl Pikes 05/17/2023, 3:56 AM

## 2023-05-18 ENCOUNTER — Other Ambulatory Visit: Payer: Self-pay

## 2023-05-18 ENCOUNTER — Inpatient Hospital Stay (HOSPITAL_COMMUNITY)

## 2023-05-18 DIAGNOSIS — F1490 Cocaine use, unspecified, uncomplicated: Secondary | ICD-10-CM

## 2023-05-18 DIAGNOSIS — F1090 Alcohol use, unspecified, uncomplicated: Secondary | ICD-10-CM | POA: Diagnosis not present

## 2023-05-18 DIAGNOSIS — R569 Unspecified convulsions: Secondary | ICD-10-CM | POA: Diagnosis not present

## 2023-05-18 DIAGNOSIS — Z5902 Unsheltered homelessness: Secondary | ICD-10-CM | POA: Diagnosis not present

## 2023-05-18 DIAGNOSIS — J9601 Acute respiratory failure with hypoxia: Secondary | ICD-10-CM | POA: Diagnosis not present

## 2023-05-18 LAB — GLUCOSE, CAPILLARY
Glucose-Capillary: 86 mg/dL (ref 70–99)
Glucose-Capillary: 80 mg/dL (ref 70–99)
Glucose-Capillary: 142 mg/dL — ABNORMAL HIGH (ref 70–99)
Glucose-Capillary: 108 mg/dL — ABNORMAL HIGH (ref 70–99)
Glucose-Capillary: 165 mg/dL — ABNORMAL HIGH (ref 70–99)

## 2023-05-18 LAB — CBC
HCT: 39.3 % (ref 36.0–46.0)
Hemoglobin: 13.2 g/dL (ref 12.0–15.0)
MCH: 32.8 pg (ref 26.0–34.0)
MCHC: 33.6 g/dL (ref 30.0–36.0)
MCV: 97.8 fL (ref 80.0–100.0)
Platelets: 229 10*3/uL (ref 150–400)
RBC: 4.02 MIL/uL (ref 3.87–5.11)
RDW: 12.7 % (ref 11.5–15.5)
WBC: 7 10*3/uL (ref 4.0–10.5)
nRBC: 0 % (ref 0.0–0.2)

## 2023-05-18 LAB — BASIC METABOLIC PANEL
Anion gap: 5 (ref 5–15)
BUN: 10 mg/dL (ref 6–20)
CO2: 24 mmol/L (ref 22–32)
Calcium: 7.9 mg/dL — ABNORMAL LOW (ref 8.9–10.3)
Chloride: 111 mmol/L (ref 98–111)
Creatinine, Ser: 0.85 mg/dL (ref 0.44–1.00)
GFR, Estimated: >60 mL/min (ref >60)
Glucose, Bld: 78 mg/dL (ref 70–99)
Potassium: 4.3 mmol/L (ref 3.5–5.1)
Sodium: 140 mmol/L (ref 135–145)

## 2023-05-18 LAB — MAGNESIUM: Magnesium: 2 mg/dL (ref 1.7–2.4)

## 2023-05-18 LAB — PHOSPHORUS: Phosphorus: 3.6 mg/dL (ref 2.5–4.6)

## 2023-05-18 MED ORDER — THIAMINE MONONITRATE 100 MG PO TABS
100.0000 mg | ORAL_TABLET | Freq: Every day | ORAL | Status: DC
  Administered 2023-05-18 – 2023-05-19 (×2): 100 mg via ORAL
  Filled 2023-05-18 (×2): qty 1

## 2023-05-18 MED ORDER — FOLIC ACID 1 MG PO TABS
1.0000 mg | ORAL_TABLET | Freq: Every day | ORAL | Status: DC
  Administered 2023-05-18 – 2023-05-19 (×2): 1 mg via ORAL
  Filled 2023-05-18 (×2): qty 1

## 2023-05-18 MED ORDER — ADULT MULTIVITAMIN W/MINERALS CH
1.0000 | ORAL_TABLET | Freq: Every day | ORAL | Status: DC
  Administered 2023-05-18 – 2023-05-19 (×2): 1 via ORAL
  Filled 2023-05-18 (×2): qty 1

## 2023-05-18 MED ORDER — INFLUENZA VIRUS VACC SPLIT PF (FLUZONE) 0.5 ML IM SUSY
0.5000 mL | PREFILLED_SYRINGE | INTRAMUSCULAR | Status: AC
  Administered 2023-05-19: 0.5 mL via INTRAMUSCULAR
  Filled 2023-05-18: qty 0.5

## 2023-05-18 MED ORDER — ALBUTEROL SULFATE (2.5 MG/3ML) 0.083% IN NEBU
2.5000 mg | INHALATION_SOLUTION | Freq: Four times a day (QID) | RESPIRATORY_TRACT | Status: DC | PRN
Start: 2023-05-18 — End: 2023-05-19

## 2023-05-18 MED ORDER — LORAZEPAM 1 MG PO TABS: 0.0000 mg | ORAL_TABLET | Freq: Three times a day (TID) | ORAL | Status: DC

## 2023-05-18 MED ORDER — LORAZEPAM 1 MG PO TABS: 1.0000 mg | ORAL_TABLET | ORAL | Status: DC | PRN

## 2023-05-18 MED ORDER — AZITHROMYCIN 500 MG PO TABS
500.0000 mg | ORAL_TABLET | Freq: Once | ORAL | Status: AC
  Administered 2023-05-18: 500 mg via ORAL
  Filled 2023-05-18 (×2): qty 1

## 2023-05-18 MED ORDER — SODIUM CHLORIDE 0.9% FLUSH
3.0000 mL | Freq: Two times a day (BID) | INTRAVENOUS | Status: DC
  Administered 2023-05-18 – 2023-05-19 (×3): 3 mL via INTRAVENOUS

## 2023-05-18 MED ORDER — THIAMINE HCL 100 MG/ML IJ SOLN: 100.0000 mg | Freq: Every day | INTRAMUSCULAR | Status: DC

## 2023-05-18 MED ORDER — FAMOTIDINE 20 MG PO TABS
20.0000 mg | ORAL_TABLET | Freq: Two times a day (BID) | ORAL | Status: DC
  Administered 2023-05-18 – 2023-05-19 (×2): 20 mg via ORAL
  Filled 2023-05-18 (×2): qty 1

## 2023-05-18 MED ORDER — PNEUMOCOCCAL 20-VAL CONJ VACC 0.5 ML IM SUSY
0.5000 mL | PREFILLED_SYRINGE | INTRAMUSCULAR | Status: AC
  Administered 2023-05-19: 0.5 mL via INTRAMUSCULAR
  Filled 2023-05-18 (×2): qty 0.5

## 2023-05-18 MED ORDER — LORAZEPAM 1 MG PO TABS
0.0000 mg | ORAL_TABLET | ORAL | Status: DC
  Administered 2023-05-18 – 2023-05-19 (×3): 1 mg via ORAL
  Administered 2023-05-19: 2 mg via ORAL
  Filled 2023-05-18 (×3): qty 1
  Filled 2023-05-18: qty 2

## 2023-05-18 NOTE — Progress Notes (Signed)
 EEG complete - results pending.   (Pt was ordered as a STAT LTM yesterday and there was a note to "hold off" until today. I checked with Dr. Otelia Limes this morning before leads placement to verify that an LTM was still needed. Verication was to do an LTM. After leads were placed, and study was running, Dr. Otelia Limes decided that pt only needed a routine EEG. Tech went back to the lab to get collodion remover and ran EEG as a routine EEG, d/c'ing leads after study was complete).

## 2023-05-18 NOTE — Progress Notes (Signed)
 NAME:  Charlene Woods, MRN:  [id number], DOB:  07-31-1970, LOS: 2 ADMISSION DATE:  05/16/2023, CONSULTATION DATE:  05/16/2023 REFERRING MD:  Tilden Fossa, MD, CHIEF COMPLAINT:  Found Down  History of Present Illness:  53 y/o female current every day smoker who has a PMH for COPD, ETOH abuse, Drug abuse, Anxiety, Depression, S/p ORIF Left Humerus presents to EMS for AMS. Pt was found lying facedown at a [employer], unresponsive for unknown time. Per bystanders, hx of cocaine use. On EMS arrival, pt with pinpoint pupils, apneic. 1mg  Narcan given without improvement. Pt then vomited, seized, given 5mg  IM versed, intubated. Started biting and gagging. Given Fentanyl. Pt then had seizure like activity enroute and given another 5mg  IM of versed.    EMS intubated with 7.0 tube, 20g IV to L AC, 20g IV to R forearm. VS 80, bp 136/87. ET 26-35. C-collar in place; Abrasion noted to forehead. Questionabe Seizures x 2 per EMS, none in the ED.  Pertinent  Medical History  COPD ETOH/Drug abuse Homeless  Significant Hospital Events: Including procedures, antibiotic start and stop dates in addition to other pertinent events   3/15: Found down, intubated in the field 3/16 Extubated 3/17 For transfer  Interim History / Subjective:  No further seizures. Extubated and sats good on 2 L overnight. Net + 3 L  Objective   Blood pressure (!) 148/87, pulse (!) 59, temperature 99.3 F (37.4 C), resp. rate 18, height 5\' 3"  (1.6 m), weight 53 kg, SpO2 100%.    Vent Mode: PSV;CPAP FiO2 (%):  [30 %-36 %] 36 % Pressure Support:  [8 cmH20] 8 cmH20   Intake/Output Summary (Last 24 hours) at 05/18/2023 0843 Last data filed at 05/18/2023 0800 Gross per 24 hour  Intake 2914.67 ml  Output 1285 ml  Net 1629.67 ml   Filed Weights   05/16/23 2008 05/17/23 0313 05/18/23 0500  Weight: 52.2 kg 52.8 kg 53 kg    Examination: General: Sleeping, easy to arouse, in NAD on 2 L [place] HENT: pupils round 3mm, injected  sclera, no teeth Lungs: Bilateral chest excursion, Diminished per bases, junky cough, few wheezes Cardiovascular: reg s1s2 no murmurs Abdomen: soft nt nd bs pos, Body mass index is 20.7 kg/m.  Extremities: no cyanosis, clubbing or edema, dirty nails Neuro: off sedation, still somnolent ,  follows commands and moves all limbs with normal strength.    Ancillary tests personally reviewed.   Net + 3 L CBC WNL Creatinine 0.85  Assessment & Plan:   Acute respiratory failure, hypoxic type, now intubated, Vent management Pneumonia-possible aspiration, Broad spectrum antibiotics Current every day smoker  Extubated 3/16 Plan: - Titrate oxygen  Maintain sats > 90%, ok to try RA and monitor sats - Flutter Valve Q 2 - IS Q 1 while awake  - Swallow eval prior to starting Diet - CXR in am - OOB to chair - Will add CIWA Ativan  as appears to be withdrawing - Nicotine patch - Add Albuterol prn for wheezing, dyspnea - Continue Azithro and Rocephin to cover for possible aspiration   Found down unresponsive/AMS-CT head and CTA head show 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  Seizures provoked by cocaine, loaded x 1 with Keppra Extubated and sedation off 3/16 Plan Monitor for seizures Neuro checks Q 4 If fever, consider LP to RO meningitis Spot check EEG per Neuro orders Appreciate neurology assistance  Neurosurgery consult now ( Per Neurology, they will call) to follow up to  paraclinoid  aneurysm 5 x 5 mm   Hyperkalemia, resolved  Lactic Acidosis  Plan Will check lactic acid to show cleared Check CK  Homeless, Substance Abuse Plan Care Management consulted  [facility] transfer to [facility] med surg  bed for continued monitoring while on CIWA Protocol/ receiving po ativan. Consult to neuro surgery Dr. Titus Dubin to eval aneurysm   Best Practice (right click and "Reselect all SmartList Selections" daily)   Diet/type: NPO w/ meds via tube DVT prophylaxis LMWH Pressure ulcer(s): N/A GI  prophylaxis: H2B Lines: N/A Foley:  N/A Code Status:  full code  CRITICAL CARE Performed by: Bevelyn Ngo   Total critical care time: 40 minutes  Critical care time was exclusive of separately billable procedures and treating other patients.  Critical care was necessary to treat or prevent imminent or life-threatening deterioration.  Critical care was time spent personally by me on the following activities: development of treatment plan with patient and/or surrogate as well as nursing, discussions with consultants, evaluation of patient's response to treatment, examination of patient, obtaining history from patient or surrogate, ordering and performing treatments and interventions, ordering and review of laboratory studies, ordering and review of radiographic studies, pulse oximetry, re-evaluation of patient's condition and participation in multidisciplinary rounds.  Bevelyn Ngo, MSN, AGACNP-BC [facility] See Amion for personal pager PCCM on call pager [phone]  05/18/2023 9:24 AM

## 2023-05-18 NOTE — Progress Notes (Signed)
 NEUROLOGY CONSULT FOLLOW UP NOTE   Date of service: May 18, 2023 [name] MRN:  [id number] DOB:  Feb 27, 1971  Interval Hx/subjective  Extubated. Awake and alert. States that she is [place] or [place]; denies any prior history of seizures; admits to using crack cocaine; she states that she is a daily heavy beer drinker without recent abstinence  Vitals   Vitals:   05/18/23 0930 05/18/23 1000 05/18/23 1030 05/18/23 1100  BP: (!) 154/90 (!) 150/107 (!) 133/109 128/84  Pulse: (!) 59 76 78 70  Resp: 13 17 15 13   Temp: 99.5 F (37.5 C)     TempSrc:      SpO2: 100% 100% 100% 98%  Weight:      Height:         Body mass index is 20.7 kg/m.  Physical Exam   Physical Exam  HEENT:  [place]/AT  Lungs: Respirations unlabored Extremities: Warm and well perfused.   Neurological Examination Mental Status: Awake, alert and oriented. Speech fluent with intact naming and comprehension. Follows all commands. Can answer detailed questions about her living situation (homeless and [place] without a tent or sleeping bag; denies any prior history of seizures; admits to using crack cocaine; daily heavy beer drinker without recent abstinence).  Cranial Nerves: II: PERRL. Fixates upon examiner's face and tracks visual stimuli.   III,IV, VI: No ptosis. EOMI but with saccadic visual pursuits. No nystagmus.  VII: Smile symmetric VIII: Hearing intact to conversation IX,X: Mild hoarseness with no hypophonia XI: Symmetric XII: Midline tongue extension Motor: Moves all 4 extremities without asymmetry Sensory: No deficit noted Cerebellar: No ataxia noted Gait: Deferred  Medications  Current Facility-Administered Medications:    acetaminophen (TYLENOL) 160 MG/5ML solution 650 mg, 650 mg, Per Tube, Q4H PRN, Darl Pikes, MD, 650 mg at 05/17/23 1204   albuterol (PROVENTIL) (2.5 MG/3ML) 0.083% nebulizer solution 2.5 mg, 2.5 mg,  Nebulization, Q6H PRN, Bevelyn Ngo, NP   azithromycin (ZITHROMAX) 500 mg in sodium chloride 0.9 % 250 mL IVPB, 500 mg, Intravenous, Q24H, Oretha Milch, MD, Stopped at 05/17/23 2132   cefTRIAXone (ROCEPHIN) 1 g in sodium chloride 0.9 % 100 mL IVPB, 1 g, Intravenous, Q24H, Oretha Milch, MD, Stopped at 05/17/23 2319   Chlorhexidine Gluconate Cloth 2 % PADS 6 each, 6 each, Topical, Daily, Rozann Lesches, MD, 6 each at 05/18/23 0944   docusate (COLACE) 50 MG/5ML liquid 100 mg, 100 mg, Per Tube, BID PRN, Arabella Merles, RPH   famotidine (PEPCID) IVPB 20 mg premix, 20 mg, Intravenous, Q12H, Rozann Lesches, MD, Last Rate: 100 mL/hr at 05/18/23 1053, 20 mg at 05/18/23 1053   folic acid (FOLVITE) tablet 1 mg, 1 mg, Oral, Daily, [place] Lodge, Sarah F, NP, 1 mg at 05/18/23 0944   heparin injection 5,000 Units, 5,000 Units, Subcutaneous, Q8H, Rozann Lesches, MD, 5,000 Units at 05/18/23 0555   insulin aspart (novoLOG) injection 0-15 Units, 0-15 Units, Subcutaneous, Q4H, Rozann Lesches, MD, 2 Units at 05/17/23 0019   LORazepam (ATIVAN) tablet 0-4 mg, 0-4 mg, Oral, Q4H **FOLLOWED BY** [START ON 05/20/2023] LORazepam (ATIVAN) tablet 0-4 mg, 0-4 mg, Oral, Q8H, Groce, Sarah F, NP   LORazepam (ATIVAN) tablet 1-4 mg, 1-4 mg, Oral, Q1H PRN, Bevelyn Ngo, NP   multivitamin with minerals tablet 1 tablet, 1 tablet, Oral, Daily, Bevelyn Ngo, NP, 1 tablet at 05/18/23 0944   ondansetron (ZOFRAN) injection 4 mg, 4 mg,  Intravenous, Q6H PRN, Rozann Lesches, MD   Oral care mouth rinse, 15 mL, Mouth Rinse, PRN, Agarwala, Ravi, MD   polyethylene glycol (MIRALAX / GLYCOLAX) packet 17 g, 17 g, Per Tube, Daily PRN, Rozann Lesches, MD   sodium chloride flush (NS) 0.9 % injection 3 mL, 3 mL, Intravenous, Q12H, [place] Lodge, Sarah F, NP, 3 mL at 05/18/23 1610   thiamine (VITAMIN B1) tablet 100 mg, 100 mg, Oral, Daily, 100 mg at 05/18/23 0944 **OR** thiamine (VITAMIN B1) injection 100 mg, 100 mg, Intravenous, Daily, Bevelyn Ngo, NP  Labs and  Diagnostic Imaging   CBC:  Recent Labs  Lab 05/16/23 1953 05/16/23 2006 05/17/23 0048 05/17/23 0412 05/18/23 0643  WBC 12.8*  --  9.1  --  7.0  NEUTROABS 6.2  --   --   --   --   HGB 17.9*   < > 14.4 15.0 13.2  HCT 52.6*   < > 42.3 44.0 39.3  MCV 96.9  --  96.6  --  97.8  PLT 336  --  309  --  229   < > = values in this interval not displayed.    Basic Metabolic Panel:  Lab Results  Component Value Date   NA 140 05/18/2023   K 4.3 05/18/2023   CO2 24 05/18/2023   GLUCOSE 78 05/18/2023   BUN 10 05/18/2023   CREATININE 0.85 05/18/2023   CALCIUM 7.9 (L) 05/18/2023   GFRNONAA >60 05/18/2023   GFRAA >60 11/10/2019   Lipid Panel: No results found for: "LDLCALC" HgbA1c:  Lab Results  Component Value Date   HGBA1C 5.3 05/17/2023   Urine Drug Screen:     Component Value Date/Time   LABOPIA NONE DETECTED 05/16/2023 2156   COCAINSCRNUR POSITIVE (A) 05/16/2023 2156   COCAINSCRNUR POSITIVE (A) 05/14/2018 0944   LABBENZ POSITIVE (A) 05/16/2023 2156   AMPHETMU NONE DETECTED 05/16/2023 2156   THCU NONE DETECTED 05/16/2023 2156   LABBARB NONE DETECTED 05/16/2023 2156    Alcohol Level     Component Value Date/Time   ETH 574 (HH) 05/16/2023 1953   INR  Lab Results  Component Value Date   INR 0.9 11/05/2019     Assessment  Charlene Woods is a 53 y.o. homeless female with a PMHx of alcohol use, crack cocaine use, depression and COPD who was brought in on Sunday evening after being found down by EMS. She had 2 witnessed seizures and was intubated.  Her urine drug screen was positive for cocaine and her blood alcohol level was elevated to 574.  CT head without contrast with no hemorrhage or large strokes.  CT angio of the head and neck with no large vessel occlusion.   - Exam this morning reveals no focal neurological deficits.  - No clinical seizures have been observed inpatient.  - Today she provides additional history. She states that she is homeless and [place]  [place] without a tent or sleeping bag; denies any prior history of seizures; admits to using crack cocaine; she states that she is a daily heavy beer drinker without recent abstinence - We suspect that her seizures were probably provoked by cocaine use.  Recommendations  - CIWA protocol. May need scheduled benzodiazepine for 2-3 days followed by PRN. Once out of the DT time window, discharge can be considered.  - Seizure precautions - Currently on LTM EEG. Expect this to be low yield.  - As this was a provoked seizure, there is no indication for  continuing Keppra, unless EEG reveals a seizure focus.  - EtOH and cocaine cessation counseling - Case Manager will need to consult in order to assist with finding housing for this severely disadvantaged homeless patient.  - Neurohospitalist service will follow up on LTM EEG results and discontinue if it is negative. Please call our service for reevaluation if she has another seizure.  ______________________________________________________________________   Dessa Phi, Rhilyn Battle, MD [facility]

## 2023-05-18 NOTE — Procedures (Signed)
 [name]  MRN: [id number]  Epilepsy Attending: Charlsie Quest  Referring Physician/Provider: Caryl Pina, MD  Date: 05/18/2023 Duration: 42.37 mins  Patient history: 53 y.o. female with hx of alcohol use, cocaine use, depression, COPD, cocaine use who was brought in after being found down by EMS, facedown in a motel confused and poorly responsive. EEG to evaluate for seizure  Level of alertness: Awake, asleep  AEDs during EEG study: None  Technical aspects: This EEG study was done with scalp electrodes positioned according to the 10-20 International system of electrode placement. Electrical activity was reviewed with band pass filter of 1-70Hz , sensitivity of 7 uV/mm, display speed of 28mm/sec with a 60Hz  notched filter applied as appropriate. EEG data were recorded continuously and digitally stored.  Video monitoring was available and reviewed as appropriate.  Description: The posterior dominant rhythm consists of 8 Hz activity of moderate voltage (25-35 uV) seen predominantly in posterior head regions, symmetric and reactive to eye opening and eye closing. Sleep was characterized by vertex waves, sleep spindles (12 to 14 Hz), maximal frontocentral region. Hyperventilation and photic stimulation were not performed.     IMPRESSION: This study is within normal limits. No seizures or epileptiform discharges were seen throughout the recording.  A normal interictal EEG does not exclude the diagnosis of epilepsy.  Elta Angell Annabelle Harman

## 2023-05-18 NOTE — Consult Note (Incomplete)
 Chief Complaint: Right paraclinoid aneurysm. Request is for cerebral arteriogram   Referring Physician(s): Dr. Caryl Pina  Supervising Physician: Julieanne Cotton  Patient Status: [facility] - In-pt  History of Present Illness: Charlene Woods is a 53 y.o. female inpatient. Smoker. History of COPD, ETOH abuse, drug abuse. Presented to the ED at [facility] on 3.15.25 with AMS after being found unresponsive face down in a motel. Questionable seizures X 2 en route per EMS. 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  Right paraclinoid aneurysm measures 5 x 5 mm. Request is for ***  Currently extubated. All labs are within acceptable parameters. No p2y12. Patient is on heparin prohylatic dosage and levophed. Allergies include ASA, Codeine and morphine.   Past Medical History:  Diagnosis Date   Alcohol abuse    Anxiety    Asthma    COPD (chronic obstructive pulmonary disease) (HCC)    Depression    Teeth missing     Past Surgical History:  Procedure Laterality Date   LUMBAR WOUND DEBRIDEMENT N/A 11/05/2019   Procedure: Incision and Drainage of Open Lumbar Spine Fracture;  Surgeon: Tressie Stalker, MD;  Location: [facility] OR;  Service: Neurosurgery;  Laterality: N/A;   ORIF HUMERUS FRACTURE Left 11/09/2019   Procedure: OPEN REDUCTION INTERNAL FIXATION (ORIF) PROXIMAL HUMERUS FRACTURE;  Surgeon: Roby Lofts, MD;  Location: [facility] OR;  Service: Orthopedics;  Laterality: Left;   TUBAL LIGATION      Allergies: Asa [aspirin], Bee venom, Codeine, Morphine and codeine, and Shellfish allergy  Medications: Prior to Admission medications   Medication Sig Start Date End Date Taking? Authorizing Provider  albuterol (VENTOLIN HFA) 108 (90 Base) MCG/ACT inhaler Inhale 2 puffs into the lungs every 4 (four) hours as needed for wheezing or shortness of breath.  Patient not taking: Reported on 05/18/2023 07/09/19   [provider]  amLODipine (NORVASC) 5 MG tablet Take 5 mg by mouth at bedtime. Patient not taking: Reported on 05/18/2023 07/09/19   [provider]  Prenatal Vit-Fe Fumarate-FA (PRENATAL MULTIVITAMIN) TABS tablet Take 1 tablet by mouth daily at 12 noon. Patient not taking: Reported on 05/18/2023    [provider]  varenicline (CHANTIX) 0.5 MG tablet Take one tablet daily days 1-3. Then take two tablets daily days 4-7. After one week, increase to two tablets twice per day and continue for at least 11 weeks. Patient not taking: Reported on 05/18/2023 08/18/19   Arvilla Market, MD     Family History  Problem Relation Age of Onset   Breast cancer Mother 58   Diabetes Mother    Ovarian cancer Neg Hx    Colon cancer Neg Hx     Social History   Socioeconomic History   Marital status: Married    Spouse name: Not on file   Number of children: Not on file   Years of education: Not on file   Highest education level: Not on file  Occupational History   Not on file  Tobacco Use   Smoking status: Every Day    Current packs/day: 0.50    Types: Cigarettes   Smokeless tobacco: Never  Substance and Sexual Activity   Alcohol use: Yes    Alcohol/week: 3.0 standard drinks of alcohol    Types: 3 Cans of beer per week    Comment: 4-5 qd    Drug use: No   Sexual activity:  Yes    Birth control/protection: Surgical  Other Topics Concern   Not on file  Social History Narrative   ** Merged History Encounter **       Social Drivers of Health   Financial Resource Strain: Not on file  Food Insecurity: Patient Unable To Answer (05/17/2023)   Hunger Vital Sign    Worried About Running Out of Food in the Last Year: Patient unable to answer    Ran Out of Food in the Last Year: Patient unable to answer  Transportation Needs: Patient Unable To Answer (05/17/2023)   PRAPARE - Transportation    Lack of Transportation (Medical): Patient unable to answer    Lack of  Transportation (Non-Medical): Patient unable to answer  Physical Activity: Not on file  Stress: Not on file  Social Connections: Patient Unable To Answer (05/17/2023)   Social Connection and Isolation Panel [NHANES]    Frequency of Communication with Friends and Family: Patient unable to answer    Frequency of Social Gatherings with Friends and Family: Patient unable to answer    Attends Religious Services: Patient unable to answer    Active Member of Clubs or Organizations: Patient unable to answer    Attends Banker Meetings: Patient unable to answer    Marital Status: Patient unable to answer    ECOG Status: {CHL ONC ECOG YQ:[id number]}  Review of Systems: A 12 point ROS discussed and pertinent positives are indicated in the HPI above.  All other systems are negative.  Review of Systems  Vital Signs: BP (!) 154/90   Pulse (!) 59   Temp 99.5 F (37.5 C)   Resp 13   Ht 5\' 3"  (1.6 m)   Wt 116 lb 13.5 oz (53 kg)   LMP  (LMP Unknown) Comment: Level 1 trauma  SpO2 100%   BMI 20.70 kg/m     Physical Exam  Imaging: MR BRAIN W WO CONTRAST Result Date: 05/17/2023 CLINICAL DATA:  Seizure EXAM: MRI HEAD WITHOUT AND WITH CONTRAST TECHNIQUE: Multiplanar, multiecho pulse sequences of the brain and surrounding structures were obtained without and with intravenous contrast. CONTRAST:  5mL GADAVIST GADOBUTROL 1 MMOL/ML IV SOLN COMPARISON:  CTA head neck 05/16/2023 FINDINGS: Brain: No acute infarct, mass effect or extra-axial collection. No acute or chronic hemorrhage. Normal white matter signal, parenchymal volume and CSF spaces. The midline structures are normal. Vascular: Right paraclinoid aneurysm Skull and upper cervical spine: Normal calvarium and skull base. Visualized upper cervical spine and soft tissues are normal. Sinuses/Orbits:No paranasal sinus fluid levels or advanced mucosal thickening. No mastoid or middle ear effusion. Normal orbits. IMPRESSION: 1. Normal MRI of  the brain. 2. Right paraclinoid aneurysm better characterized on recent CTA Electronically Signed   By: Deatra Robinson M.D.   On: 05/17/2023 22:40   CT ANGIO HEAD NECK W WO CM Result Date: 05/16/2023 CLINICAL DATA:  Altered mental status EXAM: CT ANGIOGRAPHY HEAD AND NECK WITH AND WITHOUT CONTRAST TECHNIQUE: Multidetector CT imaging of the head and neck was performed using the standard protocol during bolus administration of intravenous contrast. Multiplanar CT image reconstructions and MIPs were obtained to evaluate the vascular anatomy. Carotid stenosis measurements (when applicable) are obtained utilizing NASCET criteria, using the distal internal carotid diameter as the denominator. RADIATION DOSE REDUCTION: This exam was performed according to the departmental dose-optimization program which includes automated exposure control, adjustment of the mA and/or kV according to patient size and/or use of iterative reconstruction technique. CONTRAST:  75mL OMNIPAQUE IOHEXOL 350  MG/ML SOLN COMPARISON:  None Available. FINDINGS: CTA NECK FINDINGS Skeleton: No acute abnormality or high grade bony spinal canal stenosis. Other neck: Normal pharynx, larynx and major salivary glands. No cervical lymphadenopathy. Unremarkable thyroid gland. Upper chest: Emphysema Aortic arch: There is no calcific atherosclerosis of the aortic arch. Normal variant aortic arch branching pattern with the brachiocephalic and left common carotid arteries sharing a common origin. RIGHT carotid system: No dissection, occlusion or aneurysm. Mild atherosclerotic calcification at the carotid bifurcation without hemodynamically significant stenosis. LEFT carotid system: Normal without aneurysm, dissection or stenosis. Vertebral arteries: Codominant configuration. There is no dissection, occlusion or flow-limiting stenosis to the skull base (V1-V3 segments). CTA HEAD FINDINGS POSTERIOR CIRCULATION: Vertebral arteries are normal. No proximal occlusion of  the anterior or inferior cerebellar arteries. Basilar artery is normal. Superior cerebellar arteries are normal. Posterior cerebral arteries are normal. ANTERIOR CIRCULATION: Bilateral ICA atherosclerotic calcification. Moderate stenosis of the lacerum segment of the left ICA. Right paraclinoid aneurysm measures 5 x 5 mm. Anterior cerebral arteries are normal. Middle cerebral arteries are normal. Venous sinuses: As permitted by contrast timing, patent. Anatomic variants: None Review of the MIP images confirms the above findings. IMPRESSION: 1. No emergent large vessel occlusion. 2. Moderate stenosis of the lacerum segment of the left ICA. 3. Right paraclinoid aneurysm measures 5 x 5 mm. Electronically Signed   By: Deatra Robinson M.D.   On: 05/16/2023 23:20   CT Cervical Spine Wo Contrast Result Date: 05/16/2023 CLINICAL DATA:  Neck trauma, intoxicated or obtunded (Age >= 16y). EXAM: CT CERVICAL SPINE WITHOUT CONTRAST TECHNIQUE: Multidetector CT imaging of the cervical spine was performed without intravenous contrast. Multiplanar CT image reconstructions were also generated. RADIATION DOSE REDUCTION: This exam was performed according to the departmental dose-optimization program which includes automated exposure control, adjustment of the mA and/or kV according to patient size and/or use of iterative reconstruction technique. COMPARISON:  None Available. FINDINGS: Alignment: Normal Skull base and vertebrae: No acute fracture. No primary bone lesion or focal pathologic process. Soft tissues and spinal canal: No prevertebral fluid or swelling. No visible canal hematoma. Disc levels: Disc space narrowing most pronounced at C5-6 and C6-7 with spurring. Mild bilateral degenerative facet disease. Upper chest: No acute findings.  Emphysema. Other: None IMPRESSION: Degenerative disc and facet disease. No acute bony abnormality. Electronically Signed   By: Charlett Nose M.D.   On: 05/16/2023 20:51   CT Head Wo  Contrast Result Date: 05/16/2023 CLINICAL DATA:  Head trauma.  Abnormal mental status. EXAM: CT HEAD WITHOUT CONTRAST TECHNIQUE: Contiguous axial images were obtained from the base of the skull through the vertex without intravenous contrast. RADIATION DOSE REDUCTION: This exam was performed according to the departmental dose-optimization program which includes automated exposure control, adjustment of the mA and/or kV according to patient size and/or use of iterative reconstruction technique. COMPARISON:  None Available. FINDINGS: Brain: No evidence of old or acute infarction. No mass, hemorrhage, hydrocephalus or extra-axial collection. Vascular: Atherosclerotic calcification of the vessels at the base of the brain. This includes a ring calcification projecting upward from the supraclinoid ICA on the right worrisome for an aneurysm which if present, would measure about 7 mm in size. CTA recommended at some point in the evaluation. Skull: No skull fracture. Sinuses/Orbits: Some inflammatory changes of the paranasal sinuses. Orbits negative. Other: Orogastric tube loops in the oropharynx before passing down the esophagus. IMPRESSION: 1. No acute or traumatic finding. 2. Atherosclerotic calcification of the vessels at the base of the brain.  This includes a ring calcification projecting upward from the supraclinoid ICA on the right worrisome for an aneurysm which if present, would measure about 7 mm in size. CTA recommended at some point in the evaluation. 3. Orogastric tube loops in the oropharynx before passing down the esophagus. Electronically Signed   By: Paulina Fusi M.D.   On: 05/16/2023 20:51   DG Chest Portable 1 View Result Date: 05/16/2023 CLINICAL DATA:  Intubated EXAM: PORTABLE CHEST 1 VIEW COMPARISON:  03/04/2016 FINDINGS: Single frontal view of the chest demonstrates endotracheal tube overlying tracheal air column, tip approximately 1.5 cm above carina. Enteric catheter passes below diaphragm, tip  projecting over the gastric fundus. Cardiac silhouette is unremarkable. Faint areas of consolidation are seen in the right infrahilar region, which could reflect hypoventilatory changes or acute airspace disease. No effusion or pneumothorax. Prior ORIF proximal left humeral fracture. No acute bony abnormalities. IMPRESSION: 1. Support devices as above. 2. Patchy right infrahilar consolidation, which could reflect hypoventilatory changes or airspace disease. Aspiration could be considered in the appropriate setting. Electronically Signed   By: Sharlet Salina M.D.   On: 05/16/2023 20:28    Labs:  CBC: Recent Labs    05/16/23 1953 05/16/23 2006 05/16/23 2123 05/17/23 0048 05/17/23 0412 05/18/23 0643  WBC 12.8*  --   --  9.1  --  7.0  HGB 17.9*   < > 16.3* 14.4 15.0 13.2  HCT 52.6*   < > 48.0* 42.3 44.0 39.3  PLT 336  --   --  309  --  229   < > = values in this interval not displayed.    COAGS: No results for input(s): "INR", "APTT" in the last 8760 hours.  BMP: Recent Labs    05/16/23 2006 05/16/23 2105 05/16/23 2123 05/17/23 0048 05/17/23 0412 05/18/23 0643  NA 134*  134* 137 140 140 145 140  K 6.3*  6.3* 3.7 3.7 3.7 3.8 4.3  CL 108 106  --  111  --  111  CO2  --  18*  --  20*  --  24  GLUCOSE 123* 119*  --  108*  --  78  BUN 15 11  --  9  --  10  CALCIUM  --  8.3*  --  7.3*  --  7.9*  CREATININE 1.00 0.77  --  0.70  0.69  --  0.85  GFRNONAA  --  >60  --  >60  >60  --  >60    LIVER FUNCTION TESTS: Recent Labs    05/16/23 2105  BILITOT 0.6  AST 23  ALT 14  ALKPHOS 61  PROT 7.0  ALBUMIN 3.7      Assessment and Plan:  53 y.o. female inpatient. Smoker. History of COPD, ETOH abuse, drug abuse. Presented [facility] ED at [facility] on 3.15.25 with AMS after being found unresponsive face down in a motel. Questionable seizures X 2 en route per EMS. Found to have a right para clinoid aneurysm CT Head from 3.15.25 read  para Moderate stenosis of the lacerum segment of the  left ICA.  Right paraclinoid aneurysm measures 5 x 5 mm. Request is for ***  PLAN: Image Guided cerebral arteriogram   Thank you for this interesting consult. I greatly enjoyed meeting Aubreana Cornacchia and look forward to participating in their care.  A copy of this report was sent to the requesting provider on this date.     Electronically Signed: Alene Mires, NP 05/18/2023,  10:22 AM   I spent a total of {New INPT:304952001} {New Out-Pt:[id number]}  {Established Out-Pt:304952003} in face to face in clinical consultation, greater than 50% of which was counseling/coordinating care for ***

## 2023-05-18 NOTE — Evaluation (Signed)
 Physical Therapy Evaluation Patient Details Name: Charlene Woods MRN: [id number] DOB: [birth year] Today's Date: 05/18/2023  History of Present Illness  The pt is a 53 yo female presenting 3/15 found down at a motel, unresponsive for unknown time. Pt with seizure-like activity with EMS. Intubated in field, extubated 3/16. PMH includes: COPD, ETOH abuse, Drug abuse (cocaine), Anxiety, Depression, S/p ORIF Left Humerus.    Clinical Impression  Pt in bed upon arrival of PT, agreeable to evaluation at this time. Prior to admission the pt was mobilizing with use of rollator, reports she does not recall any other falls, and is typically independent with all mobility and ADLs. The pt reports she lives in a motel with her brother in law, and that he would be available for intermittent assist after return home. The pt was able to complete bed mobility, initial transfers, and short bout of hallway ambulation with use of RW and minA. She was most limited by chronic R foot pain with mobility today, will benefit from continued skilled PT acutely to maintain pt's mobility, and could benefit from a pair of shoes (pt reports mostly walking barefoot or in socks) to cushion R foot for reduced pain during ambulation. She is near her baseline mobility, suspect she could return home with intermittent supervision once medically cleared.       If plan is discharge home, recommend the following: A little help with walking and/or transfers;Assistance with cooking/housework;Direct supervision/assist for medications management;Direct supervision/assist for financial management;Assist for transportation;Help with stairs or ramp for entrance   Can travel by private vehicle        Equipment Recommendations Other (comment) (shower chair)  Recommendations for Other Services       Functional Status Assessment Patient has had a recent decline in their functional status and demonstrates the ability to make significant  improvements in function in a reasonable and predictable amount of time.     Precautions / Restrictions Precautions Precautions: Fall Recall of Precautions/Restrictions: Intact Precaution/Restrictions Comments: seizure Restrictions Weight Bearing Restrictions Per Provider Order: No      Mobility  Bed Mobility Overal bed mobility: Needs Assistance Bed Mobility: Supine to Sit     Supine to sit: Min assist     General bed mobility comments: minA to complete with increased time and use of bed rails    Transfers Overall transfer level: Needs assistance Equipment used: Rolling walker (2 wheels) Transfers: Sit to/from Stand Sit to Stand: Contact guard assist           General transfer comment: CGA for safety, BUE spport    Ambulation/Gait Ambulation/Gait assistance: Contact guard assist, Min assist Gait Distance (Feet): 75 Feet Assistive device: Rolling walker (2 wheels) Gait Pattern/deviations: Step-through pattern, Decreased stride length, Decreased weight shift to right, Antalgic Gait velocity: decreased Gait velocity interpretation: <1.31 ft/sec, indicative of household ambulator   General Gait Details: pt with minimal wt on RLE, at times walking on lateral aspect of her foot. no overt buckling or LOB, pt states pain is chronic      Balance Overall balance assessment: Needs assistance Sitting-balance support: No upper extremity supported Sitting balance-Leahy Scale: Good     Standing balance support: Bilateral upper extremity supported, During functional activity Standing balance-Leahy Scale: Fair Standing balance comment: dependent on UE support for gait                             Pertinent Vitals/Pain Pain Assessment Pain Assessment:  Faces Faces Pain Scale: Hurts even more Pain Location: R foot with wt bearing. pt reports this is chronic Pain Descriptors / Indicators: Discomfort, Grimacing Pain Intervention(s): Limited activity within  patient's tolerance, Monitored during session, Repositioned    Home Living Family/patient expects to be discharged to:: Other (Comment)                   Additional Comments: from motel with ground-level room with tub shower and regular height toilet.    Prior Function Prior Level of Function : Needs assist;History of Falls (last six months);Patient poor historian/Family not available             Mobility Comments: pt reports use of rollator, states she is unsure if she had any falls ADLs Comments: pt reports independence     Extremity/Trunk Assessment   Upper Extremity Assessment Upper Extremity Assessment: Generalized weakness    Lower Extremity Assessment Lower Extremity Assessment: Generalized weakness (R foot pain with wt bearing, no overt buckling, grossly 4/5 to MMT bilaterally, reports no changes in sensation)    Cervical / Trunk Assessment Cervical / Trunk Assessment: Kyphotic;Other exceptions Cervical / Trunk Exceptions: frail  Communication   Communication Communication: No apparent difficulties    Cognition Arousal: Alert Behavior During Therapy: Flat affect   PT - Cognitive impairments: No family/caregiver present to determine baseline                       PT - Cognition Comments: pt oriented to person, place, and month/year, did not know day and does not remeber incident. able to follow commands, no family present to confirm cognitive baseline. pt with flat affect and states her thinking feels normal Following commands: Intact       Cueing Cueing Techniques: Verbal cues     General Comments General comments (skin integrity, edema, etc.): VSS on RA        Assessment/Plan    PT Assessment Patient needs continued PT services  PT Problem List Decreased strength;Decreased activity tolerance;Decreased balance;Decreased mobility;Decreased safety awareness       PT Treatment Interventions DME instruction;Gait training;Stair  training;Functional mobility training;Therapeutic activities;Therapeutic exercise;Balance training;Cognitive remediation;Patient/family education    PT Goals (Current goals can be found in the Care Plan section)  Acute Rehab PT Goals Patient Stated Goal: return to motel PT Goal Formulation: With patient Time For Goal Achievement: 06/01/23 Potential to Achieve Goals: Good    Frequency Min 2X/week        AM-PAC PT "6 Clicks" Mobility  Outcome Measure Help needed turning from your back to your side while in a flat bed without using bedrails?: A Little Help needed moving from lying on your back to sitting on the side of a flat bed without using bedrails?: A Little Help needed moving to and from a bed to a chair (including a wheelchair)?: A Little Help needed standing up from a chair using your arms (e.g., wheelchair or bedside chair)?: A Little Help needed to walk in hospital room?: A Little Help needed climbing 3-5 steps with a railing? : A Lot 6 Click Score: 17    End of Session Equipment Utilized During Treatment: Gait belt Activity Tolerance: Patient tolerated treatment well Patient left: in chair;with call bell/phone within reach;with chair alarm set Nurse Communication: Mobility status PT Visit Diagnosis: Other abnormalities of gait and mobility (R26.89);Muscle weakness (generalized) (M62.81);Pain Pain - Right/Left: Right Pain - part of body: Ankle and joints of foot    Time: 1205-1227  PT Time Calculation (min) (ACUTE ONLY): 22 min   Charges:   PT Evaluation $PT Eval Moderate Complexity: 1 Mod   PT General Charges $$ ACUTE PT VISIT: 1 Visit         Vickki Muff, PT, DPT   Acute Rehabilitation Department Office [phone] Secure Chat Communication Preferred  Ronnie Derby 05/18/2023, 3:37 PM

## 2023-05-19 ENCOUNTER — Inpatient Hospital Stay (HOSPITAL_COMMUNITY)

## 2023-05-19 ENCOUNTER — Other Ambulatory Visit (HOSPITAL_COMMUNITY): Payer: Self-pay

## 2023-05-19 DIAGNOSIS — R4182 Altered mental status, unspecified: Secondary | ICD-10-CM | POA: Diagnosis not present

## 2023-05-19 LAB — COMPREHENSIVE METABOLIC PANEL
ALT: 18 U/L (ref 0–44)
AST: 26 U/L (ref 15–41)
Albumin: 2.9 g/dL — ABNORMAL LOW (ref 3.5–5.0)
Alkaline Phosphatase: 45 U/L (ref 38–126)
Anion gap: 8 (ref 5–15)
BUN: 18 mg/dL (ref 6–20)
CO2: 26 mmol/L (ref 22–32)
Calcium: 8.6 mg/dL — ABNORMAL LOW (ref 8.9–10.3)
Chloride: 104 mmol/L (ref 98–111)
Creatinine, Ser: 0.8 mg/dL (ref 0.44–1.00)
GFR, Estimated: >60 mL/min (ref >60)
Glucose, Bld: 94 mg/dL (ref 70–99)
Potassium: 4.1 mmol/L (ref 3.5–5.1)
Sodium: 138 mmol/L (ref 135–145)
Total Bilirubin: 0.6 mg/dL (ref 0.0–1.2)
Total Protein: 5.7 g/dL — ABNORMAL LOW (ref 6.5–8.1)

## 2023-05-19 LAB — CBC WITH DIFFERENTIAL/PLATELET
Abs Immature Granulocytes: 0.02 10*3/uL (ref 0.00–0.07)
Basophils Absolute: 0 10*3/uL (ref 0.0–0.1)
Basophils Relative: 0 %
Eosinophils Absolute: 0.2 10*3/uL (ref 0.0–0.5)
Eosinophils Relative: 3 %
HCT: 43.7 % (ref 36.0–46.0)
Hemoglobin: 14.8 g/dL (ref 12.0–15.0)
Immature Granulocytes: 0 %
Lymphocytes Relative: 42 %
Lymphs Abs: 2.9 10*3/uL (ref 0.7–4.0)
MCH: 32.5 pg (ref 26.0–34.0)
MCHC: 33.9 g/dL (ref 30.0–36.0)
MCV: 96 fL (ref 80.0–100.0)
Monocytes Absolute: 0.6 10*3/uL (ref 0.1–1.0)
Monocytes Relative: 9 %
Neutro Abs: 3.2 10*3/uL (ref 1.7–7.7)
Neutrophils Relative %: 46 %
Platelets: 244 10*3/uL (ref 150–400)
RBC: 4.55 MIL/uL (ref 3.87–5.11)
RDW: 12.3 % (ref 11.5–15.5)
WBC: 7 10*3/uL (ref 4.0–10.5)
nRBC: 0 % (ref 0.0–0.2)

## 2023-05-19 LAB — GLUCOSE, CAPILLARY
Glucose-Capillary: 107 mg/dL — ABNORMAL HIGH (ref 70–99)
Glucose-Capillary: 109 mg/dL — ABNORMAL HIGH (ref 70–99)
Glucose-Capillary: 136 mg/dL — ABNORMAL HIGH (ref 70–99)
Glucose-Capillary: 138 mg/dL — ABNORMAL HIGH (ref 70–99)
Glucose-Capillary: 188 mg/dL — ABNORMAL HIGH (ref 70–99)

## 2023-05-19 LAB — MAGNESIUM: Magnesium: 1.9 mg/dL (ref 1.7–2.4)

## 2023-05-19 LAB — PHOSPHORUS: Phosphorus: 4.1 mg/dL (ref 2.5–4.6)

## 2023-05-19 MED ORDER — LEVOFLOXACIN 500 MG PO TABS
500.0000 mg | ORAL_TABLET | Freq: Every day | ORAL | Status: DC
  Administered 2023-05-19: 500 mg via ORAL
  Filled 2023-05-19: qty 1

## 2023-05-19 MED ORDER — LEVOFLOXACIN 500 MG PO TABS
500.0000 mg | ORAL_TABLET | Freq: Every day | ORAL | 0 refills | Status: AC
  Filled 2023-05-19: qty 3, 3d supply, fill #0
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
## 2023-05-19 NOTE — Progress Notes (Signed)
 Pt discharged with sister.  Pt is alert and oriented.  Pt does not want to stay in the hospital any longer.

## 2023-05-19 NOTE — TOC CAGE-AID Note (Signed)
 Transition of Care [facility]) - CAGE-AID Screening   Patient Details  Name: Dejanee Thibeaux MRN: [id number] Date of Birth: September 06, 1970  [facility]) CM/SW Contact:    Elberta Fortis, RN Phone Number: 05/19/2023, 2:03 PM   Clinical Narrative:  Resources for inpt and outpt drug and alcohol counseling provided to pt.    CAGE-AID Screening:    Have You Ever Felt You Ought to Cut Down on Your Drinking or Drug Use?: Yes Have People Annoyed You By Critizing Your Drinking Or Drug Use?: Yes Have You Felt Bad Or Guilty About Your Drinking Or Drug Use?: No Have You Ever Had a Drink or Used Drugs First Thing In The Morning to Steady Your Nerves or to Get Rid of a Hangover?: Yes CAGE-AID Score: 3  Substance Abuse Education Offered: Yes  Substance abuse interventions: Patient Counseling

## 2023-05-19 NOTE — TOC Transition Note (Signed)
 Transition of Care [facility]) - Discharge Note   Patient Details  Name: Charlene Woods MRN: [id number] Date of Birth: 12-21-1970  Transition of Care [facility]) CM/SW Contact:  Elberta Fortis, RN Phone Number: 05/19/2023, 4:48 PM   Clinical Narrative:    Pt discharging to sister's home who will also transport her. Front wheel walker provided per [employer]. Outpatient therapy added to AVS. Pt will call to schedule first appointment. Information for Medicaid transportation added tom AVS.    Final next level of care: OP Rehab Barriers to Discharge: Barriers Resolved   Patient Goals and CMS Choice            Discharge Placement                       Discharge Plan and Services Additional resources added to the After Visit Summary for     Discharge Planning Services: CM Consult            DME Arranged: Dan Humphreys rolling DME Agency: [employer] Date DME Agency Contacted: 05/19/23   Representative spoke with at DME Agency: DC lounge            Social Drivers of Health (SDOH) Interventions SDOH Screenings   Food Insecurity: Patient Unable To Answer (05/17/2023)  Housing: Patient Unable To Answer (05/17/2023)  Transportation Needs: Patient Unable To Answer (05/17/2023)  Utilities: Patient Unable To Answer (05/17/2023)  Social Connections: Patient Unable To Answer (05/17/2023)  Tobacco Use: High Risk (05/16/2023)     Readmission Risk Interventions     No data to display

## 2023-05-19 NOTE — Plan of Care (Signed)
  Problem: Education: Goal: Ability to describe self-care measures that may prevent or decrease complications (Diabetes Survival Skills Education) will improve Outcome: Adequate for Discharge Goal: Individualized Educational Video(s) Outcome: Adequate for Discharge   Problem: Coping: Goal: Ability to adjust to condition or change in health will improve Outcome: Adequate for Discharge   Problem: Fluid Volume: Goal: Ability to maintain a balanced intake and output will improve Outcome: Adequate for Discharge   Problem: Health Behavior/Discharge Planning: Goal: Ability to identify and utilize available resources and services will improve Outcome: Adequate for Discharge Goal: Ability to manage health-related needs will improve Outcome: Adequate for Discharge   Problem: Metabolic: Goal: Ability to maintain appropriate glucose levels will improve Outcome: Adequate for Discharge   Problem: Nutritional: Goal: Maintenance of adequate nutrition will improve Outcome: Adequate for Discharge Goal: Progress toward achieving an optimal weight will improve Outcome: Adequate for Discharge   Problem: Skin Integrity: Goal: Risk for impaired skin integrity will decrease Outcome: Adequate for Discharge   Problem: Tissue Perfusion: Goal: Adequacy of tissue perfusion will improve Outcome: Adequate for Discharge   Problem: Education: Goal: Knowledge of General Education information will improve Description: Including pain rating scale, medication(s)/side effects and non-pharmacologic comfort measures Outcome: Adequate for Discharge   Problem: Health Behavior/Discharge Planning: Goal: Ability to manage health-related needs will improve Outcome: Adequate for Discharge   Problem: Clinical Measurements: Goal: Ability to maintain clinical measurements within normal limits will improve Outcome: Adequate for Discharge Goal: Will remain free from infection Outcome: Adequate for Discharge Goal:  Diagnostic test results will improve Outcome: Adequate for Discharge Goal: Respiratory complications will improve Outcome: Adequate for Discharge Goal: Cardiovascular complication will be avoided Outcome: Adequate for Discharge   Problem: Activity: Goal: Risk for activity intolerance will decrease Outcome: Adequate for Discharge   Problem: Nutrition: Goal: Adequate nutrition will be maintained Outcome: Adequate for Discharge   Problem: Coping: Goal: Level of anxiety will decrease Outcome: Adequate for Discharge   Problem: Elimination: Goal: Will not experience complications related to bowel motility Outcome: Adequate for Discharge Goal: Will not experience complications related to urinary retention Outcome: Adequate for Discharge   Problem: Pain Managment: Goal: General experience of comfort will improve and/or be controlled Outcome: Adequate for Discharge   Problem: Safety: Goal: Ability to remain free from injury will improve Outcome: Adequate for Discharge   Problem: Skin Integrity: Goal: Risk for impaired skin integrity will decrease Outcome: Adequate for Discharge   Problem: Safety: Goal: Non-violent Restraint(s) Outcome: Adequate for Discharge
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
## 2023-05-19 NOTE — Plan of Care (Signed)
 LTM EEG: This study is within normal limits. No seizures or epileptiform discharges were seen throughout the recording.  Order to discontinue EEG has been placed.   Please see yesterday's note for additional recommendations.   Electronically signed: Dr. Caryl Pina
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
## 2023-05-19 NOTE — Progress Notes (Signed)
 Patient walked in the room with stand by assist.  She was slow but steady and turned around well.

## 2023-05-19 NOTE — Plan of Care (Signed)
  Problem: Health Behavior/Discharge Planning: Goal: Ability to identify and utilize available resources and services will improve Outcome: Progressing Goal: Ability to manage health-related needs will improve Outcome: Progressing   Problem: Skin Integrity: Goal: Risk for impaired skin integrity will decrease Outcome: Progressing   Problem: Nutritional: Goal: Maintenance of adequate nutrition will improve Outcome: Progressing Goal: Progress toward achieving an optimal weight will improve Outcome: Progressing   Problem: Clinical Measurements: Goal: Ability to maintain clinical measurements within normal limits will improve Outcome: Progressing Goal: Will remain free from infection Outcome: Progressing Goal: Diagnostic test results will improve Outcome: Progressing Goal: Respiratory complications will improve Outcome: Progressing Goal: Cardiovascular complication will be avoided Outcome: Progressing   Problem: Elimination: Goal: Will not experience complications related to bowel motility Outcome: Progressing Goal: Will not experience complications related to urinary retention Outcome: Progressing   Problem: Safety: Goal: Ability to remain free from injury will improve Outcome: Progressing   Problem: Skin Integrity: Goal: Risk for impaired skin integrity will decrease Outcome: Progressing

## 2023-05-21 LAB — CULTURE, BLOOD (ROUTINE X 2)
Culture: NO GROWTH
Culture: NO GROWTH

## 2023-07-09 ENCOUNTER — Ambulatory Visit: Payer: Self-pay | Admitting: Family Medicine
